# Patient Record
Sex: Female | Born: 1985 | Race: White | Hispanic: No | Smoking: Never smoker
Health system: Southern US, Community
[De-identification: ages and names within clinical notes are randomized; demographics above are authoritative.]

## PROBLEM LIST (undated history)

## (undated) DIAGNOSIS — Z8614 Personal history of Methicillin resistant Staphylococcus aureus infection: Secondary | ICD-10-CM

## (undated) DIAGNOSIS — F419 Anxiety disorder, unspecified: Secondary | ICD-10-CM

## (undated) DIAGNOSIS — F32A Depression, unspecified: Secondary | ICD-10-CM

## (undated) DIAGNOSIS — G4700 Insomnia, unspecified: Secondary | ICD-10-CM

## (undated) DIAGNOSIS — N926 Irregular menstruation, unspecified: Secondary | ICD-10-CM

## (undated) DIAGNOSIS — F319 Bipolar disorder, unspecified: Secondary | ICD-10-CM

## (undated) HISTORY — DX: Bipolar disorder, unspecified: F31.9

## (undated) HISTORY — PX: WISDOM TOOTH EXTRACTION: SHX21

## (undated) HISTORY — PX: ULNAR NERVE REPAIR: SHX2594

## (undated) HISTORY — DX: Anxiety disorder, unspecified: F41.9

## (undated) HISTORY — DX: Irregular menstruation, unspecified: N92.6

## (undated) HISTORY — DX: Depression, unspecified: F32.A

## (undated) HISTORY — DX: Insomnia, unspecified: G47.00

---
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
## 2018-09-30 ENCOUNTER — Emergency Department (HOSPITAL_COMMUNITY)
Admission: EM | Admit: 2018-09-30 | Discharge: 2018-09-30 | Disposition: A | Discharge status: Home / Self Care | Payer: BLUE CROSS/BLUE SHIELD | Attending: Emergency Medicine | Admitting: Emergency Medicine

## 2018-09-30 ENCOUNTER — Encounter (HOSPITAL_COMMUNITY): Payer: Self-pay

## 2018-09-30 ENCOUNTER — Other Ambulatory Visit: Payer: Self-pay

## 2018-09-30 DIAGNOSIS — L03011 Cellulitis of right finger: Secondary | ICD-10-CM

## 2018-09-30 MED ORDER — BACITRACIN ZINC 500 UNIT/GM EX OINT: TOPICAL_OINTMENT | Freq: Once | CUTANEOUS | Status: DC

## 2018-09-30 MED ORDER — LIDOCAINE HCL (PF) 1 % IJ SOLN
10.0000 mL | Freq: Once | INTRAMUSCULAR | Status: AC
  Administered 2018-09-30: 10 mL
  Filled 2018-09-30: qty 10

## 2018-09-30 MED ORDER — CEPHALEXIN 500 MG PO CAPS: 500.0000 mg | ORAL_CAPSULE | Freq: Four times a day (QID) | ORAL | 0 refills | Status: DC

## 2018-09-30 NOTE — ED Triage Notes (Signed)
Pt arrived with c/o nail being slammed between bed frame while working.

## 2018-09-30 NOTE — ED Provider Notes (Signed)
[facility] EMERGENCY DEPARTMENT Provider Note   CSN: [id number] Arrival date & time: 09/30/18  0105    History   Chief Complaint Chief Complaint  Patient presents with  . Nail Problem    HPI Allison Mccarty is a 33 y.o. female.     Allison Mccarty is a 33 y.o. female who is otherwise healthy, presents to the emergency department for evaluation of pain and swelling to the right middle finger.  She reports about a week ago while moving the skin was torn away from her nailbed in this area and then a few days later this area became swollen and painful, she reports last night she had some purulent drainage from it and the swelling seemed to go down some but it is still very painful to the touch.  No swelling or pain over the digit pad or swelling over the proximal finger.  She denies fevers or chills, nausea or vomiting.  Reports that the finger has occasionally been tingling but no numbness or weakness and she has no difficulty moving the finger.  She has not taken anything to treat her symptoms prior to arrival, no other aggravating or alleviating factors.  Tetanus updated 2 months ago.      History reviewed. No pertinent past medical history.  There are no active problems to display for this patient.   History reviewed. No pertinent surgical history.   OB History   No obstetric history on file.      Home Medications    Prior to Admission medications   Medication Sig Start Date End Date Taking? Authorizing Provider  cephALEXin (KEFLEX) 500 MG capsule Take 1 capsule (500 mg total) by mouth 4 (four) times daily. 09/30/18   Dartha Lodge, PA-C    Family History History reviewed. No pertinent family history.  Social History Social History   Tobacco Use  . Smoking status: Not on file  Substance Use Topics  . Alcohol use: Not on file  . Drug use: Not on file     Allergies   Patient has no allergy information on record.   Review of  Systems Review of Systems  Constitutional: Negative for chills and fever.  Gastrointestinal: Negative for nausea and vomiting.  Skin: Positive for color change and wound.  Neurological: Negative for weakness and numbness.     Physical Exam Updated Vital Signs BP 104/65 (BP Location: Left Arm)   Pulse 66   Temp 97.9 F (36.6 C) (Oral)   Resp 18   SpO2 99%   Physical Exam Vitals signs and nursing note reviewed.  Constitutional:      General: She is not in acute distress.    Appearance: She is well-developed. She is not diaphoretic.  HENT:     Head: Normocephalic and atraumatic.  Eyes:     General:        Right eye: No discharge.        Left eye: No discharge.  Pulmonary:     Effort: Pulmonary effort is normal. No respiratory distress.  Musculoskeletal:     Comments: Redness and swelling along of the right middle finger consistent with paronychia, no expressible drainage currently.  Digit pad is soft, no swelling or erythema of the proximal finger  Skin:    General: Skin is warm and dry.  Neurological:     Mental Status: She is alert.     Coordination: Coordination normal.  Psychiatric:        Mood and Affect: Mood  normal.        Behavior: Behavior normal.      ED Treatments / Results  Labs (all labs ordered are listed, but only abnormal results are displayed) Labs Reviewed - No data to display  EKG None  Radiology No results found.  Procedures .Marland KitchenIncision and Drainage Date/Time: 09/30/2018 2:02 AM Performed by: Dartha Lodge, PA-C Authorized by: Dartha Lodge, PA-C   Consent:    Consent obtained:  Verbal   Consent given by:  Patient   Risks discussed:  Bleeding, incomplete drainage, pain, infection and damage to other organs Location:    Type:  Abscess (Paronychia)   Location:  Upper extremity   Upper extremity location:  Finger   Finger location:  R long finger Pre-procedure details:    Skin preparation:  Antiseptic wash Anesthesia (see MAR for  exact dosages):    Anesthesia method:  Nerve block   Block location:  Digital block   Block needle gauge:  25 G   Block anesthetic:  Lidocaine 1% w/o epi   Block injection procedure:  Anatomic landmarks identified, introduced needle and incremental injection   Block outcome:  Anesthesia achieved Procedure details:    Incision types:  Single straight   Incision depth:  Dermal   Scalpel blade:  11   Wound management:  Probed and deloculated and irrigated with saline   Drainage:  Purulent and bloody   Drainage amount:  Moderate   Wound treatment:  Wound left open   Packing materials:  None Post-procedure details:    Patient tolerance of procedure:  Tolerated well, no immediate complications   (including critical care time)  Medications Ordered in ED Medications  bacitracin ointment (has no administration in time range)  lidocaine (PF) (XYLOCAINE) 1 % injection 10 mL (10 mLs Infiltration Given by Other 09/30/18 0137)     Initial Impression / Assessment and Plan / ED Course  I have reviewed the triage vital signs and the nursing notes.  Pertinent labs & imaging results that were available during my care of the patient were reviewed by me and considered in my medical decision making (see chart for details).  Patient presents with paronychia of the right middle finger.  The finger was anesthetized with digital block and paronychia was drained with small amount of purulent drainage and bloody drainage.  Patient tolerated procedure well with no complications.  Dressing applied.  Will treat patient with Keflex for surrounding cellulitis.  Discharged home in good condition.  Discussed signs of infection that should warrant sooner return.  Patient expresses understanding and agreement with plan.  Final Clinical Impressions(s) / ED Diagnoses   Final diagnoses:  Paronychia of finger of right hand    ED Discharge Orders         Ordered    cephALEXin (KEFLEX) 500 MG capsule  4 times daily      09/30/18 0201           Dartha Lodge, PA-C 09/30/18 0230    Melene Plan, DO 09/30/18 [phone]

## 2018-09-30 NOTE — ED Notes (Signed)
Patient verbalizes understanding of discharge instructions. Opportunity for questioning and answers were provided. Armband removed by staff, pt discharged from ED.  

## 2018-09-30 NOTE — Discharge Instructions (Signed)
Take entire course of antibiotics as directed.  Soak finger in clean warm water at least twice daily and change dressing twice daily.  You can apply antibiotic ointment under your dressing.  Monitor area for signs of worsening infection such as redness, swelling, increasing pain or drainage.  If these occur return for reevaluation otherwise this should heal and improve on its own.

## 2019-08-01 ENCOUNTER — Encounter (HOSPITAL_COMMUNITY): Payer: Self-pay | Admitting: Emergency Medicine

## 2019-08-01 ENCOUNTER — Ambulatory Visit (INDEPENDENT_AMBULATORY_CARE_PROVIDER_SITE_OTHER): Payer: BLUE CROSS/BLUE SHIELD

## 2019-08-01 ENCOUNTER — Ambulatory Visit (HOSPITAL_COMMUNITY)
Admission: EM | Admit: 2019-08-01 | Discharge: 2019-08-01 | Disposition: A | Discharge status: Home / Self Care | Payer: BLUE CROSS/BLUE SHIELD | Attending: Family Medicine | Admitting: Family Medicine

## 2019-08-01 ENCOUNTER — Other Ambulatory Visit: Payer: Self-pay

## 2019-08-01 DIAGNOSIS — M25522 Pain in left elbow: Secondary | ICD-10-CM

## 2019-08-01 MED ORDER — IBUPROFEN 600 MG PO TABS: 600.0000 mg | ORAL_TABLET | Freq: Three times a day (TID) | ORAL | 0 refills | Status: DC | PRN

## 2019-08-01 NOTE — Discharge Instructions (Signed)
Please try ice  Please try the ibuprofen as needed  Please follow up if your symptoms fail to improve.

## 2019-08-01 NOTE — ED Provider Notes (Signed)
[facility]    CSN: [id number] Arrival date & time: 08/01/19  1452      History   Chief Complaint Chief Complaint  Patient presents with  . Fall  . Arm Pain    HPI Allison Mccarty is a 34 y.o. female.   She is presenting with a fall that occurred last night.  She fell onto her outstretched hands.  Now she is having pain over the elbow itself.  Has some pain with complete extension.  No significant bruising.  Has some pain with pronation and supination of the left hand.  No history of similar symptoms.  Pain is moderate to severe.  Is intermittent in nature.  HPI  History reviewed. No pertinent past medical history.  There are no problems to display for this patient.   History reviewed. No pertinent surgical history.  OB History   No obstetric history on file.      Home Medications    Prior to Admission medications   Medication Sig Start Date End Date Taking? Authorizing Provider  traZODone (DESYREL) 50 MG tablet Take by mouth. 07/17/18  Yes [provider]  cephALEXin (KEFLEX) 500 MG capsule Take 1 capsule (500 mg total) by mouth 4 (four) times daily. 09/30/18   Dartha Lodge, PA-C  ibuprofen (ADVIL) 600 MG tablet Take 1 tablet (600 mg total) by mouth every 8 (eight) hours as needed. 08/01/19   Myra Rude, MD    Family History Family History  Problem Relation Age of Onset  . Cancer Mother     Social History Social History   Tobacco Use  . Smoking status: Never Smoker  Substance Use Topics  . Alcohol use: Yes  . Drug use: Not Currently     Allergies   Patient has no known allergies.   Review of Systems Review of Systems  Constitutional: Negative for fever.  Musculoskeletal: Negative for gait problem.  Neurological: Negative for weakness.     Physical Exam Triage Vital Signs ED Triage Vitals [08/01/19 1518]  Enc Vitals Group     BP 114/75     Pulse Rate 89     Resp 18     Temp 98.2 F (36.8 C)     Temp src    SpO2 97 %     Weight      Height      Head Circumference      Peak Flow      Pain Score 7     Pain Loc      Pain Edu?      Excl. in GC?    No data found.  Updated Vital Signs BP 114/75   Pulse 89   Temp 98.2 F (36.8 C)   Resp 18   LMP 08/01/2019 (Exact Date)   SpO2 97%   Visual Acuity Right Eye Distance:   Left Eye Distance:   Bilateral Distance:    Right Eye Near:   Left Eye Near:    Bilateral Near:     Physical Exam Gen: NAD, alert, cooperative with exam, well-appearing ENT: normal lips, normal nasal mucosa,  Eye: normal EOM, normal conjunctiva and lids CV:  no edema, +2 pedal pulses   Resp: no accessory muscle use, non-labored,  Skin: no rashes, no areas of induration  Neuro: normal tone, normal sensation to touch Psych:  normal insight, alert and oriented MSK:  Left arm: Normal shoulder range of motion. Normal internal and external rotation. Normal strength resistance. Left elbow with  almost near extension. Normal strength resistance with supination and pronation. Normal wrist range of motion. Normal grip strength. Neurovascularly intact  UC Treatments / Results  Labs (all labs ordered are listed, but only abnormal results are displayed) Labs Reviewed - No data to display  EKG   Radiology DG Elbow Complete Left  Result Date: 08/01/2019 CLINICAL DATA:  Pain EXAM: LEFT ELBOW - COMPLETE 3+ VIEW COMPARISON:  None. FINDINGS: There is no evidence of fracture, dislocation, or joint effusion. There is no evidence of arthropathy or other focal bone abnormality. Soft tissues are unremarkable. IMPRESSION: Negative. Electronically Signed   By: Constance Holster M.D.   On: 08/01/2019 16:15    Procedures Procedures (including critical care time)  Medications Ordered in UC Medications - No data to display  Initial Impression / Assessment and Plan / UC Course  I have reviewed the triage vital signs and the nursing notes.  Pertinent labs & imaging results  that were available during my care of the patient were reviewed by me and considered in my medical decision making (see chart for details).     [name] is a 34 yo F that is presenting with left elbow pain after a fall. Imaging didn't suggest a fracture. Provided ibuprofen. Counseled on home exercise program and supportive care. Given indications to follow up and return.   Final Clinical Impressions(s) / UC Diagnoses   Final diagnoses:  Left elbow pain     Discharge Instructions     Please try ice  Please try the ibuprofen as needed  Please follow up if your symptoms fail to improve.     ED Prescriptions    Medication Sig Dispense Auth. Provider   ibuprofen (ADVIL) 600 MG tablet Take 1 tablet (600 mg total) by mouth every 8 (eight) hours as needed. 30 tablet Rosemarie Ax, MD     PDMP not reviewed this encounter.   Rosemarie Ax, MD 08/01/19 [phone]

## 2019-08-01 NOTE — ED Triage Notes (Signed)
Pt states she was standing on a step stool and she fell backwards and had her arms outstretched behind her, landed mostly on her L arm. C/o L shoulder pain, L elbow pain.

## 2020-02-20 LAB — HM PAP SMEAR: HM Pap smear: NEGATIVE

## 2020-02-20 LAB — RESULTS CONSOLE HPV: CHL HPV: NEGATIVE

## 2021-05-31 LAB — CBC AND DIFFERENTIAL
HCT: 41 (ref 36–46)
Hemoglobin: 13.4 (ref 12.0–16.0)
Platelets: 244 10*3/uL (ref 150–400)
WBC: 9

## 2021-05-31 LAB — BASIC METABOLIC PANEL
BUN: 8 (ref 4–21)
CO2: 24 — AB (ref 13–22)
Chloride: 104 (ref 99–108)
Creatinine: 0.8 (ref 0.5–1.1)
Glucose: 93
Potassium: 4.5 mEq/L (ref 3.5–5.1)
Sodium: 139 (ref 137–147)

## 2021-05-31 LAB — HEPATIC FUNCTION PANEL
ALT: 29 U/L (ref 7–35)
AST: 23 (ref 13–35)
Alkaline Phosphatase: 84 (ref 25–125)
Bilirubin, Total: 0.2

## 2021-05-31 LAB — LIPID PANEL
Cholesterol: 189 (ref 0–200)
HDL: 62 (ref 35–70)
LDL Cholesterol: 111
Triglycerides: 82 (ref 40–160)

## 2021-05-31 LAB — HM HEPATITIS C SCREENING LAB: HM Hepatitis Screen: NEGATIVE

## 2021-05-31 LAB — TSH: TSH: 1.61 (ref 0.41–5.90)

## 2021-11-08 ENCOUNTER — Telehealth: Payer: BC Managed Care – PPO | Admitting: Emergency Medicine

## 2021-11-08 ENCOUNTER — Ambulatory Visit: Payer: Self-pay

## 2021-11-08 DIAGNOSIS — K6289 Other specified diseases of anus and rectum: Secondary | ICD-10-CM

## 2021-11-08 NOTE — Progress Notes (Signed)
?Virtual Visit Consent  ? ?Allison Mccarty, you are scheduled for a virtual visit with a [facility] provider today.   ?  ?Just as with appointments in the office, your consent must be obtained to participate.  Your consent will be active for this visit and any virtual visit you may have with one of our providers in the next 365 days.   ?  ?If you have a MyChart account, a copy of this consent can be sent to you electronically.  All virtual visits are billed to your insurance company just like a traditional visit in the office.   ? ?As this is a virtual visit, video technology does not allow for your provider to perform a traditional examination.  This may limit your provider's ability to fully assess your condition.  If your provider identifies any concerns that need to be evaluated in person or the need to arrange testing (such as labs, EKG, etc.), we will make arrangements to do so.   ?  ?Although advances in technology are sophisticated, we cannot ensure that it will always work on either your end or our end.  If the connection with a video visit is poor, the visit may have to be switched to a telephone visit.  With either a video or telephone visit, we are not always able to ensure that we have a secure connection.    ? ?I need to obtain your verbal consent now.   Are you willing to proceed with your visit today?  ?  ?[name] has provided verbal consent on 11/08/2021 for a virtual visit (video or telephone). ?  ?Roxy Horseman, PA-C  ? ?Date: 11/08/2021 12:01 PM ? ? ?Virtual Visit via Video Note  ? ?Allison Mccarty, connected with  Allison Mccarty  ([id number], April 10, 1986) on 11/08/21 at 12:00 PM EDT by a video-enabled telemedicine application and verified that I am speaking with the correct person using two identifiers. ? ?Location: ?Patient: Virtual Visit Location Patient: Mobile ?Provider: Virtual Visit Location Provider: Home Office ?  ?I discussed the limitations of evaluation and  management by telemedicine and the availability of in person appointments. The patient expressed understanding and agreed to proceed.   ? ?History of Present Illness: ?Allison Mccarty is a 36 y.o. who identifies as a female who was assigned female at birth, and is being seen today for severe rectal pain that started last night.  She reports having some blood in her stool that she doesn't associate with her menstrual cycle.  She states that the pain is unlike that of hemorrhoids and feels like something is "sucking her insides out."  She rates the pain as a 10/10 in severity.  She states that she almost went to the ER last night when it started. ? ?HPI: HPI  ?Problems: There are no problems to display for this patient. ?  ?Allergies: No Known Allergies ?Medications:  ?Current Outpatient Medications:  ?  cephALEXin (KEFLEX) 500 MG capsule, Take 1 capsule (500 mg total) by mouth 4 (four) times daily., Disp: 28 capsule, Rfl: 0 ?  ibuprofen (ADVIL) 600 MG tablet, Take 1 tablet (600 mg total) by mouth every 8 (eight) hours as needed., Disp: 30 tablet, Rfl: 0 ?  traZODone (DESYREL) 50 MG tablet, Take by mouth., Disp: , Rfl:  ? ?Observations/Objective: ?Patient is well-developed, well-nourished in no acute distress.  ?Head is normocephalic, atraumatic.  ?No labored breathing.  ?Speech is clear and coherent with logical content.  ?Patient is alert and oriented at baseline.  ? ? ?  Assessment and Plan: ?1. Rectal pain ? ?Redirect to ED for in person exam.  Discussed with patient that due to the amount of pain she is having with blood in the stool that she doesn't associate with her menstrual cycle, she may need advanced imaging. ? ?Follow Up Instructions: ?I discussed the assessment and treatment plan with the patient. The patient was provided an opportunity to ask questions and all were answered. The patient agreed with the plan and demonstrated an understanding of the instructions.  A copy of instructions were sent to the  patient via MyChart unless otherwise noted below.  ? ? ? ?The patient was advised to call back or seek an in-person evaluation if the symptoms worsen or if the condition fails to improve as anticipated. ? ?Time:  ?I spent 12 minutes with the patient via telehealth technology discussing the above problems/concerns.   ? ?Roxy Horseman, PA-C ? ?

## 2021-11-08 NOTE — Patient Instructions (Addendum)
Based on what you shared with me, I feel your condition warrants further evaluation as soon as possible at an Emergency department. I'm concerned that you may need advanced imaging based on the description of your pain.  This might include ultrasound or CT scan.   ?  ?NOTE: There will be NO CHARGE for this Visit ?  ?If you are having a true medical emergency please call 911.   ?  ? ?Emergency Department-[facility]  ?Get Driving Directions  ?[phone]  ?[address]  ?[place], [place] [address]  ?Open 24/7/365  ?  ?  ?[facility] Emergency Department at [facility]  ?Get Driving Directions  ?[address]  ?[place], [place] [address]  ?Open 24/7/365  ?  ?Emergency Department- [facility]  ?Get Driving Directions  ?[phone]  ?[address]  ?[place], [place] [address]  ?Open 24/7/365  ?  ?  ?[facility] Emergency Department at [facility]  ?Get Driving Directions  ?[phone]  ?[address]  ?[place], [place] [address]  ?Open 24/7/365  ?  ?[place]  ?Emergency Department- [facility]  ?Get Driving Directions  ?[phone]  ?[address]  ?[place], [place] [address]  ?Open 24/7/365  ?  ?[place]  ?Emergency Department- [facility] [place]  ?Get Driving Directions  ?[address]  ?[place], [place] [address]  ?Open 24/7/365  ?  ?[place]  ?Emergency Department- [facility]  ?Get Driving Directions  ?[phone]  ?[address]  ?[place], [place] [address]  ?Open 24/7/365  ?  ?

## 2021-11-08 NOTE — Telephone Encounter (Signed)
?  Chief Complaint: abdominal pain ?Symptoms: abdominal pain that radiates into back, comes and goes, pain with urination and urgency ?Frequency: yesterday around 4:00pm ?Pertinent Negatives: Patient denies burning with urination ?Disposition: [] ED /[] Urgent Care (no appt availability in office) / [] Appointment(In office/virtual)/ [x]  [facility]/ [] Home Care/ [] Refused Recommended Disposition /[] [facility]/ []  Follow-up with PCP ?Additional Notes: pt scheduled new pt appt with [facility] but they dont offer acute visits prior to NPA. I advised pt she could go to UC to be seen or if set up Mychart we could schedule for VUC visit and they could let her know if she needed to be seen in person or prescribe something during visit. Pt states she will set up Mychart and schedule appt. Confirmed pt scheduled VUC visit today at 1200.  ? ?Reason for Disposition ? [1] MODERATE pain (e.g., interferes with normal activities) AND [2] pain comes and goes (cramps) AND [3] present > 24 hours  (Exception: pain with Vomiting or Diarrhea - see that Guideline) ? ?Answer Assessment - Initial Assessment Questions ?1. LOCATION: "Where does it hurt?"  ?    Lower back and lower abdomen  ?2. RADIATION: "Does the pain shoot anywhere else?" (e.g., chest, back) ?    Back  ?3. ONSET: "When did the pain begin?" (e.g., minutes, hours or days ago)  ?    yesterday ?5. PATTERN "Does the pain come and go, or is it constant?" ?   - If constant: "Is it getting better, staying the same, or worsening?"  ?    (Note: Constant means the pain never goes away completely; most serious pain is constant and it progresses)  ?   - If intermittent: "How long does it last?" "Do you have pain now?" ?    (Note: Intermittent means the pain goes away completely between bouts) ?    Comes and goes ?6. SEVERITY: "How bad is the pain?"  (e.g., Scale 1-10; mild, moderate, or severe) ?  - MILD (1-3): doesn't interfere with normal activities, abdomen soft and  not tender to touch  ?  - MODERATE (4-7): interferes with normal activities or awakens from sleep, abdomen tender to touch  ?  - SEVERE (8-10): excruciating pain, doubled over, unable to do any normal activities  ?    10 when present, 30 mins after 5 ?9. RELIEVING/AGGRAVATING FACTORS: "What makes it better or worse?" (e.g., movement, antacids, bowel movement) ?    Standing up worse, laying down better ?10. OTHER SYMPTOMS: "Do you have any other symptoms?" (e.g., back pain, diarrhea, fever, urination pain, vomiting) ?      Urination pain, urgency ?11. PREGNANCY: "Is there any chance you are pregnant?" "When was your last menstrual period?" ?      On period ? ?Protocols used: Abdominal Pain - Female-A-AH ? ?

## 2021-11-16 ENCOUNTER — Other Ambulatory Visit: Payer: Self-pay | Admitting: Internal Medicine

## 2021-11-16 DIAGNOSIS — N939 Abnormal uterine and vaginal bleeding, unspecified: Secondary | ICD-10-CM

## 2021-11-17 ENCOUNTER — Ambulatory Visit
Admission: RE | Admit: 2021-11-17 | Discharge: 2021-11-17 | Disposition: A | Discharge status: Home / Self Care | Payer: BC Managed Care – PPO | Source: Ambulatory Visit | Attending: Internal Medicine | Admitting: Internal Medicine

## 2021-11-17 DIAGNOSIS — N939 Abnormal uterine and vaginal bleeding, unspecified: Secondary | ICD-10-CM | POA: Diagnosis present

## 2021-12-20 ENCOUNTER — Encounter: Payer: Self-pay | Admitting: Internal Medicine

## 2021-12-20 ENCOUNTER — Ambulatory Visit: Payer: BC Managed Care – PPO | Admitting: Internal Medicine

## 2021-12-20 ENCOUNTER — Other Ambulatory Visit: Payer: Self-pay | Admitting: Internal Medicine

## 2021-12-20 VITALS — BP 122/84 | HR 86 | Ht 65.0 in | Wt 242.0 lb

## 2021-12-20 DIAGNOSIS — K594 Anal spasm: Secondary | ICD-10-CM | POA: Diagnosis not present

## 2021-12-20 DIAGNOSIS — R7303 Prediabetes: Secondary | ICD-10-CM

## 2021-12-20 DIAGNOSIS — K625 Hemorrhage of anus and rectum: Secondary | ICD-10-CM | POA: Diagnosis not present

## 2021-12-20 DIAGNOSIS — F32A Depression, unspecified: Secondary | ICD-10-CM | POA: Insufficient documentation

## 2021-12-20 DIAGNOSIS — F331 Major depressive disorder, recurrent, moderate: Secondary | ICD-10-CM

## 2021-12-20 DIAGNOSIS — N926 Irregular menstruation, unspecified: Secondary | ICD-10-CM | POA: Diagnosis not present

## 2021-12-20 DIAGNOSIS — F419 Anxiety disorder, unspecified: Secondary | ICD-10-CM | POA: Insufficient documentation

## 2021-12-20 LAB — POCT GLYCOSYLATED HEMOGLOBIN (HGB A1C): Hemoglobin A1C: 5.3 % (ref 4.0–5.6)

## 2021-12-20 MED ORDER — HYDROCORTISONE (PERIANAL) 2.5 % EX CREA: 1.0000 "application " | TOPICAL_CREAM | Freq: Two times a day (BID) | CUTANEOUS | 2 refills | Status: DC
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
## 2021-12-25 IMAGING — DX DG ELBOW COMPLETE 3+V*L*
4 series · 4 of 4 positions shown · non-contrast
Comparison: None.

CLINICAL DATA: Pain

EXAM:
LEFT ELBOW - COMPLETE 3+ VIEW

[elbow ap]
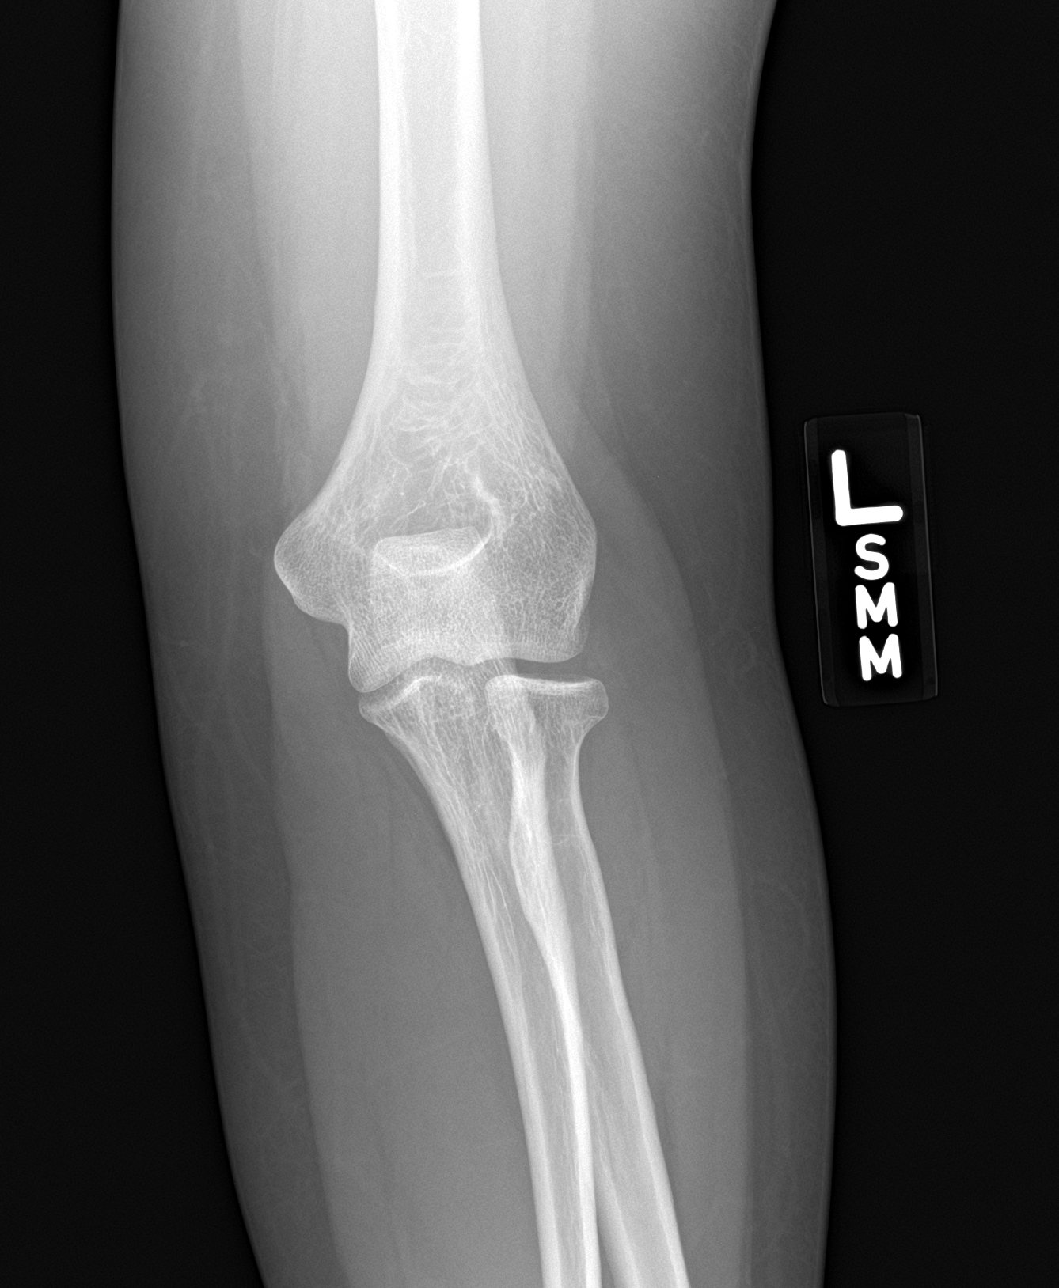

[elbow obl (1 of 2)]
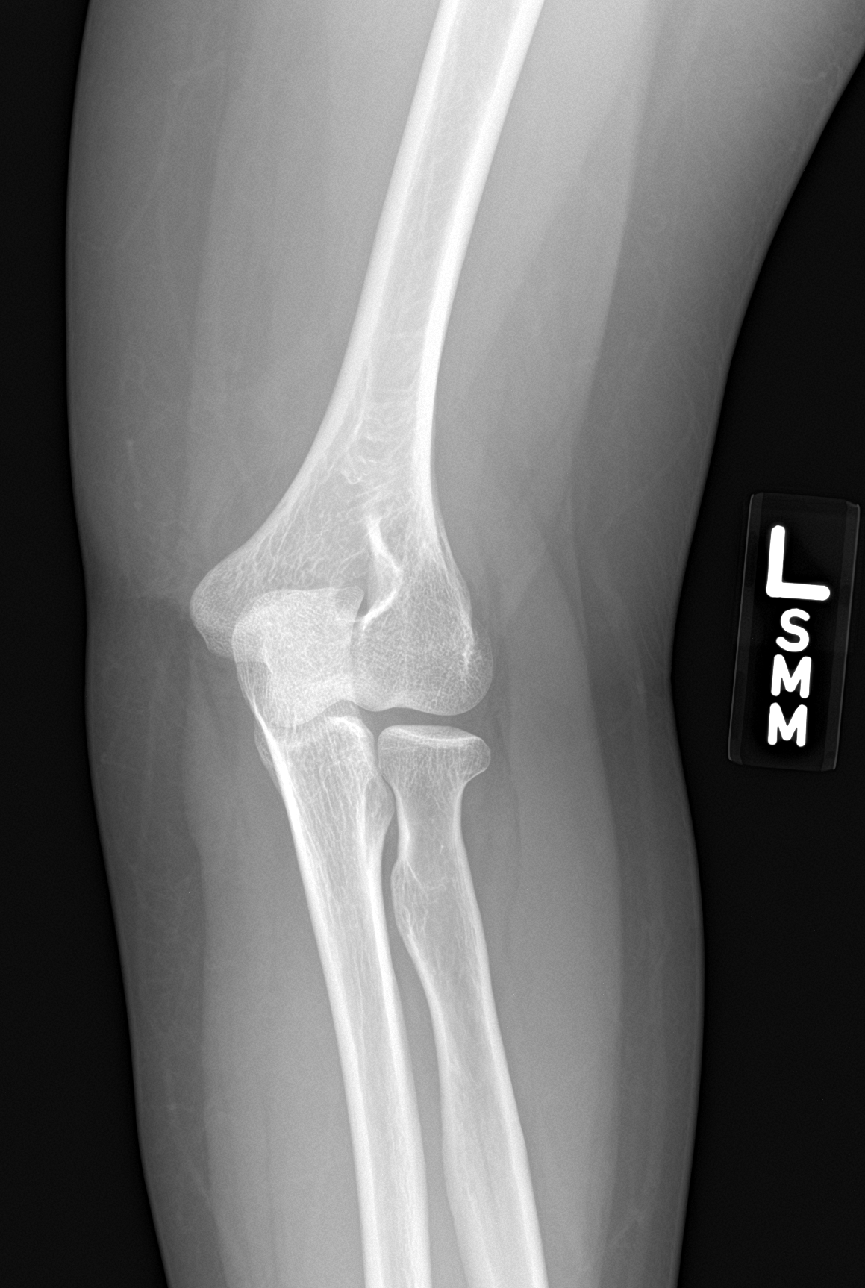

[elbow obl (2 of 2)]
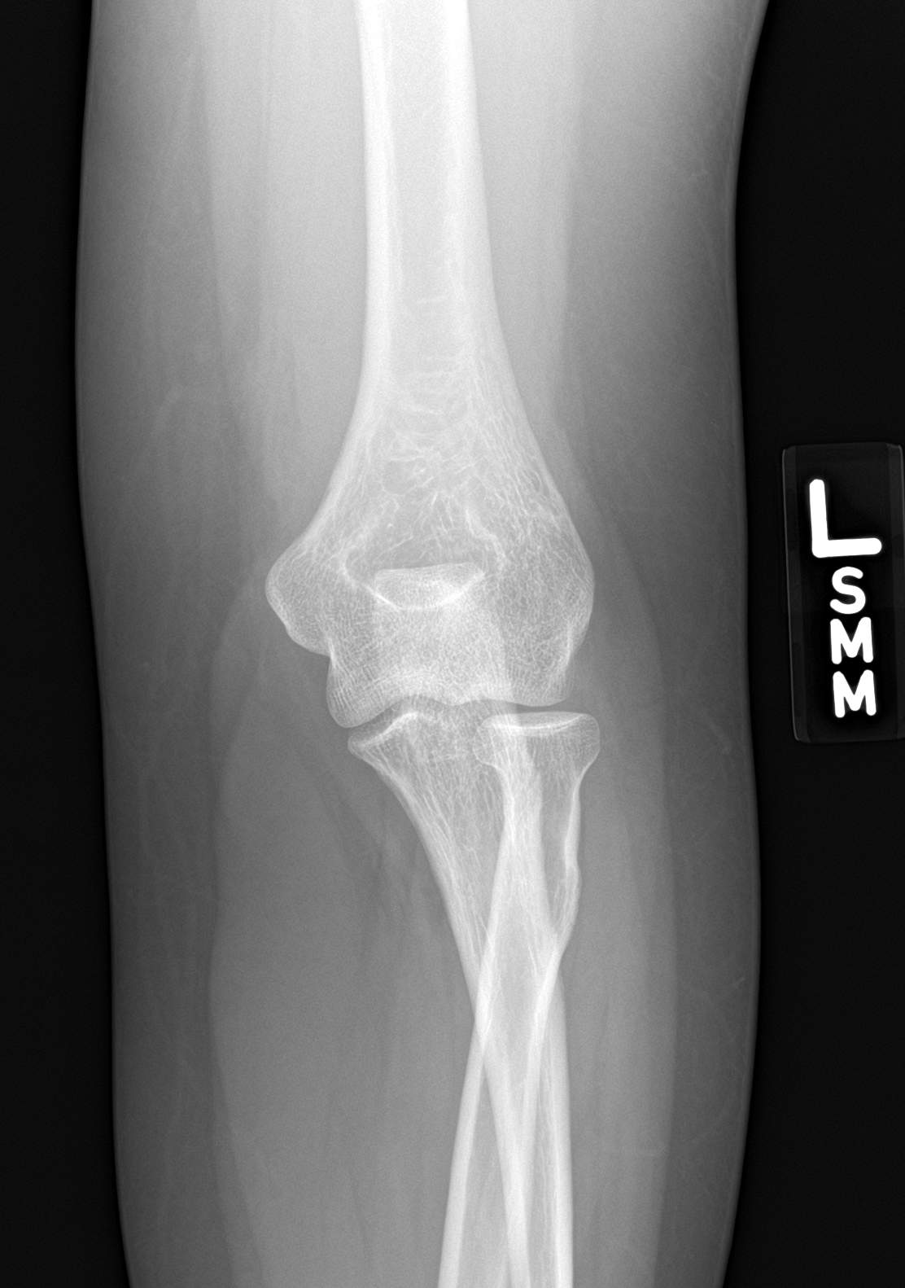

[elbow lat]
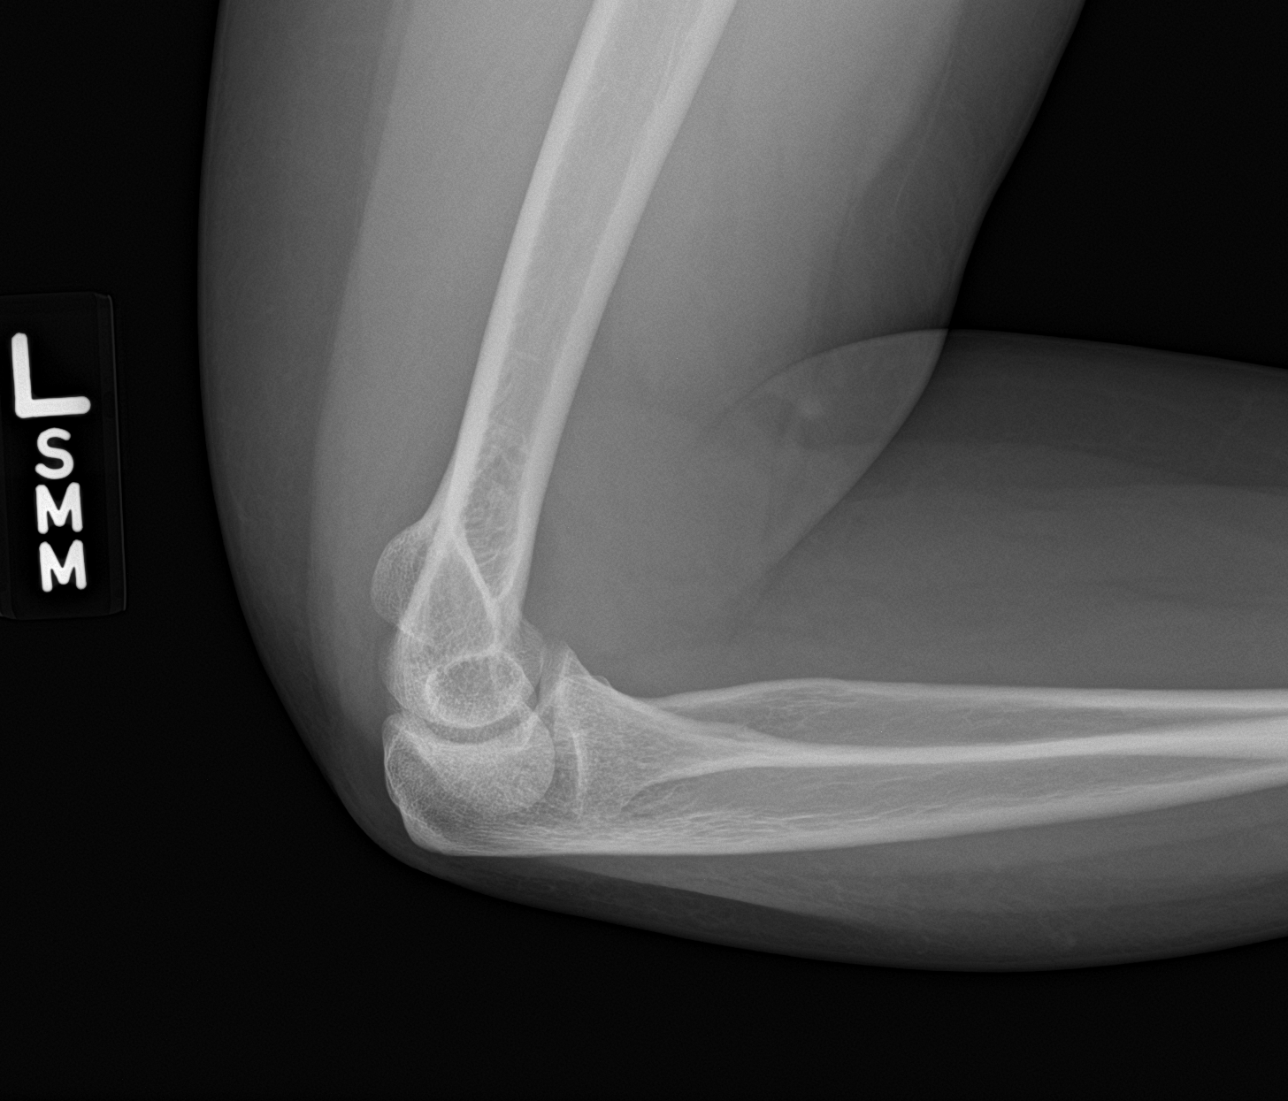

[4 of 4 positions shown; findings below may reference images not displayed]

FINDINGS: There is no evidence of fracture, dislocation, or joint effusion.
There is no evidence of arthropathy or other focal bone abnormality.
Soft tissues are unremarkable.
IMPRESSION: Negative.

## 2021-12-26 ENCOUNTER — Encounter: Payer: Self-pay | Admitting: Internal Medicine

## 2021-12-26 ENCOUNTER — Other Ambulatory Visit: Payer: Self-pay | Admitting: Internal Medicine

## 2021-12-26 DIAGNOSIS — K625 Hemorrhage of anus and rectum: Secondary | ICD-10-CM

## 2021-12-26 MED ORDER — HYDROCORTISONE ACETATE 25 MG RE SUPP: 25.0000 mg | Freq: Two times a day (BID) | RECTAL | 0 refills | Status: DC

## 2022-01-02 ENCOUNTER — Encounter: Payer: Self-pay | Admitting: Internal Medicine

## 2022-01-24 ENCOUNTER — Encounter: Payer: Self-pay | Admitting: Internal Medicine

## 2022-01-26 NOTE — Telephone Encounter (Signed)
I attempt twice trying to reach patient via phone. 01/25/22 and 01/26/22. Leaving voicemail's for patient to call back to be scheduled.

## 2022-03-21 ENCOUNTER — Encounter: Payer: Self-pay | Admitting: Advanced Practice Midwife

## 2022-03-21 ENCOUNTER — Ambulatory Visit: Payer: BLUE CROSS/BLUE SHIELD | Admitting: Internal Medicine

## 2022-04-10 ENCOUNTER — Ambulatory Visit: Payer: BC Managed Care – PPO | Admitting: Gastroenterology

## 2022-06-30 ENCOUNTER — Encounter: Payer: BC Managed Care – PPO | Admitting: Internal Medicine

## 2022-07-06 ENCOUNTER — Ambulatory Visit (INDEPENDENT_AMBULATORY_CARE_PROVIDER_SITE_OTHER): Payer: Self-pay | Admitting: Gastroenterology

## 2022-07-06 ENCOUNTER — Encounter: Payer: Self-pay | Admitting: Gastroenterology

## 2022-07-06 VITALS — BP 109/67 | HR 74 | Temp 98.7°F | Ht 65.0 in | Wt 220.0 lb

## 2022-07-06 DIAGNOSIS — K602 Anal fissure, unspecified: Secondary | ICD-10-CM

## 2022-07-06 NOTE — Progress Notes (Signed)
Gastroenterology Consultation  Referring Provider:     Glean Hess, MD Primary Care Physician:  Glean Hess, MD Primary Gastroenterologist:  Dr. Allen Norris     Reason for Consultation:     Rectal pain and rectal bleeding        HPI:   Allison Mccarty is a 37 y.o. y/o female referred for consultation & management of rectal pain and rectal bleeding by Dr. Army Melia, Jesse Sans, MD. this patient comes in today after being evaluated by her primary care provider for a history of rectal pain with rectal bleeding.  The patient had reported to her primary care provider that the rectal pain was only during her menstrual cycle.  The patient has had intermittent rectal bleeding.  It was reported that the rectal bleeding was painless.  Past Medical History:  Diagnosis Date   Anxiety 2015   Bipolar 1 disorder ([place])    Depression 2009   Insomnia    Irregular menstrual bleeding     No past surgical history on file.  Prior to Admission medications   Medication Sig Start Date End Date Taking? Authorizing Provider  cloNIDine (CATAPRES) 0.1 MG tablet Take 0.1-0.2 mg by mouth at bedtime as needed. 09/01/21   [provider]  hydrocortisone (ANUSOL-HC) 2.5 % rectal cream Place 1 application. rectally 2 (two) times daily. 12/20/21   Glean Hess, MD  hydrocortisone (ANUSOL-HC) 25 MG suppository Place 1 suppository (25 mg total) rectally 2 (two) times daily. 12/26/21   Glean Hess, MD  lamoTRIgine (LAMICTAL) 100 MG tablet Take 100 mg by mouth daily. Bipolar 11/23/21   [provider]  NIKKI 3-0.02 MG tablet Take 1 tablet by mouth daily. 11/30/21   [provider]    Family History  Problem Relation Age of Onset   Breast cancer Mother    Uterine cancer Mother    Heart disease Father    Parkinsonism Father    Ovarian cancer Maternal Aunt    Breast cancer Maternal Aunt    Stroke Maternal Grandmother    Breast cancer Maternal Grandmother      Social History    Tobacco Use   Smoking status: Never  Vaping Use   Vaping Use: Never used  Substance Use Topics   Alcohol use: Not Currently   Drug use: Not Currently    Allergies as of 07/06/2022   (No Known Allergies)    Review of Systems:    All systems reviewed and negative except where noted in HPI.   Physical Exam:  BP 109/67 (BP Location: Right Arm, Patient Position: Sitting, Cuff Size: Large)   Pulse 74   Temp 98.7 F (37.1 C) (Oral)   Ht 5\' 5"  (1.651 m)   Wt 220 lb (99.8 kg)   BMI 36.61 kg/m  No LMP recorded. General:   Alert,  Well-developed, well-nourished, pleasant and cooperative in NAD Head:  Normocephalic and atraumatic. Eyes:  Sclera clear, no icterus.   Conjunctiva pink. Ears:  Normal auditory acuity. Neck:  Supple; no masses or thyromegaly. Rectal: Digital rectal exam was painful at the 12 o'clock position with a palpable defect in the mucosa consistent with a fissure.  Skin:  Intact without significant lesions or rashes.  No jaundice. Psych:  Alert and cooperative. Normal mood and affect.  Imaging Studies: No results found.  Assessment and Plan:   Allison Mccarty is a 36 y.o. y/o female who comes in today with rectal pain and rectal bleeding.  The patient has  a fissure on rectal exam at the 12 o'clock position.  The pain was reproducible on the rectal exam.  The patient has been offered medical therapy with continued softening of her stools and avoid hard stools and dehydration.  The patient has also been set up for an appoint with Dr. Everlene Farrier for possible surgical intervention for the anal fissure with anal myotomy.  The patient has been explained the plan and agrees with it.    Midge Minium, MD. Clementeen Graham    Note: This dictation was prepared with Dragon dictation along with smaller phrase technology. Any transcriptional errors that result from this process are unintentional.

## 2022-07-10 ENCOUNTER — Ambulatory Visit (INDEPENDENT_AMBULATORY_CARE_PROVIDER_SITE_OTHER): Payer: Self-pay | Admitting: Surgery

## 2022-07-10 ENCOUNTER — Other Ambulatory Visit: Payer: Self-pay

## 2022-07-10 ENCOUNTER — Encounter: Payer: Self-pay | Admitting: Surgery

## 2022-07-10 ENCOUNTER — Telehealth: Payer: Self-pay

## 2022-07-10 ENCOUNTER — Encounter: Payer: Self-pay | Admitting: Internal Medicine

## 2022-07-10 VITALS — BP 136/85 | HR 75 | Temp 98.3°F | Ht 65.0 in | Wt 221.0 lb

## 2022-07-10 DIAGNOSIS — K602 Anal fissure, unspecified: Secondary | ICD-10-CM

## 2022-07-10 NOTE — Telephone Encounter (Signed)
Nifedipine ointment called to [facility].

## 2022-07-10 NOTE — Patient Instructions (Addendum)
Pick your medication up at the pharmacy below. Please call to see if its ready prior to picking it up.  [facility]  [address] [place] [address] [phone]    How to Take a Sitz Bath A sitz bath is a warm water bath that may be used to care for your rectum, genital area, or the area between your rectum and genitals (perineum). In a sitz bath, the water only comes up to your hips and covers your buttocks. A sitz bath may be done in a bathtub or with a portable sitz bath that fits over the toilet. Your health care provider may recommend a sitz bath to help: Relieve pain and discomfort after delivering a baby. Relieve pain and itching from hemorrhoids or anal fissures. Relieve pain after certain surgeries. Relax muscles that are sore or tight. How to take a sitz bath Take 2-4 sitz baths a day, or as many as told by your health care provider. Bathtub sitz bath To take a sitz bath in a bathtub: Partially fill a bathtub with warm water. The water should be deep enough to cover your hips and buttocks when you are sitting in the bathtub. Follow your health care provider's instructions if you are told to put medicine in the water. Sit in the water. Open the bathtub drain a little, and leave it open during your bath. Turn on the warm water again, enough to replace the water that is draining out. Keep the water running throughout your bath. This helps keep the water at the right level and temperature. Soak in the water for 15-20 minutes, or as long as told by your health care provider. When you are done, be careful when you stand up. You may feel dizzy. After the sitz bath, pat yourself dry. Do not rub your skin to dry it.  Over-the-toilet sitz bath To take a sitz bath with an over-the-toilet basin: Follow the manufacturer's instructions. Fill the basin with warm water. Follow your health care provider's instructions if you were told to put medicine in the water. Sit on the seat.  Make sure the water covers your buttocks and perineum. Soak in the water for 15-20 minutes, or as long as told by your health care provider. After the sitz bath, pat yourself dry. Do not rub your skin to dry it. Clean and dry the basin between uses. Discard the basin if it cracks, or according to the manufacturer's instructions.  Contact a health care provider if: Your pain or itching gets worse. Stop doing sitz baths if your symptoms get worse. You have new symptoms. Stop doing sitz baths until you talk with your health care provider. Summary A sitz bath is a warm water bath in which the water only comes up to your hips and covers your buttocks. Your health care provider may recommend a sitz bath to help relieve pain and discomfort after delivering a baby, relieve pain and itching from hemorrhoids or anal fissures, relieve pain after certain surgeries, or help to relax muscles that are sore or tight. Take 2-4 sitz baths a day, or as many as told by your health care provider. Soak in the water for 15-20 minutes. Stop doing sitz baths if your symptoms get worse. This information is not intended to replace advice given to you by your health care provider. Make sure you discuss any questions you have with your health care provider. Document Revised: 10/18/2021 Document Reviewed: 10/18/2021 Elsevier Patient Education  2023 [employer]. Anal Fissure,  Adult  An anal fissure is a small tear or crack in the tissue of the anus. Bleeding from a fissure usually stops on its own within a few minutes. However, bleeding will often occur again with each bowel movement until the fissure heals. What are the causes? This condition is usually caused by passing a large or hard stool (feces). Other causes include: Constipation. Frequent diarrhea. Inflammatory bowel disease (Crohn's disease or ulcerative colitis). Childbirth. Infections. Anal sex. What are the signs or symptoms? Symptoms of this condition  include: Bleeding from the rectum. Small amounts of blood seen on your stool, on the toilet paper, or in the toilet after a bowel movement. The blood coats the outside of the stool and is not mixed with the stool. Painful bowel movements. Itching or irritation around the anus. How is this diagnosed? A health care provider may diagnose this condition by closely examining the anal area. An anal fissure can usually be seen with careful inspection. In some cases, a rectal exam may be performed, or a short tube (anoscope) may be used to examine the anal canal. How is this treated? Initial treatment for this condition may include: Taking steps to avoid constipation. This may include making changes to your diet, such as increasing your intake of fiber or fluid. Taking fiber supplements. These supplements can soften your stool to help make bowel movements easier. Your health care provider may also prescribe a stool softener if your stool is hard. Taking sitz baths. This may help to heal the tear. Using medicated creams or ointments. These may be prescribed to lessen discomfort. Treatments that are sometimes used if initial treatments do not work well or if the condition is more severe may include: Botulinum injection. Surgery to repair the fissure. Follow these instructions at home: Eating and drinking  Avoid foods that may cause constipation, such as bananas, milk, and other dairy products. Eat all fruits, except bananas. Drink enough fluid to keep your urine pale yellow. Eat foods that are high in fiber, such as beans, whole grains, and fresh fruits and vegetables. General instructions  Take over-the-counter and prescription medicines only as told by your health care provider. Use creams or ointments only as told by your health care provider. Keep the anal area clean and dry. Take sitz baths as told by your health care provider. Do not use soap in the sitz baths. Keep all follow-up visits as  told by your health care provider. This is important. Contact a health care provider if you have: More bleeding. A fever. Diarrhea that is mixed with blood. Pain that continues. Ongoing problems that are getting worse rather than better. Summary An anal fissure is a small tear or crack in the tissue of the anus. This condition is usually caused by passing a large or hard stool (feces). Other causes include constipation and frequent diarrhea. Initial treatment for this condition may include taking steps to avoid constipation, such as increasing your intake of fiber or fluid. Follow instructions for care as told by your health care provider. Contact your health care provider if you have more bleeding or your problem is getting worse rather than better. Keep all follow-up visits as told by your health care provider. This is important. This information is not intended to replace advice given to you by your health care provider. Make sure you discuss any questions you have with your health care provider. Document Revised: 02/10/2021 Document Reviewed: 02/10/2021 Elsevier Patient Education  2023 [employer].

## 2022-07-13 ENCOUNTER — Ambulatory Visit (INDEPENDENT_AMBULATORY_CARE_PROVIDER_SITE_OTHER): Payer: Self-pay | Admitting: Internal Medicine

## 2022-07-13 ENCOUNTER — Encounter: Payer: Self-pay | Admitting: Internal Medicine

## 2022-07-13 VITALS — BP 128/76 | HR 72 | Ht 65.0 in | Wt 226.6 lb

## 2022-07-13 DIAGNOSIS — K602 Anal fissure, unspecified: Secondary | ICD-10-CM

## 2022-07-13 DIAGNOSIS — L723 Sebaceous cyst: Secondary | ICD-10-CM

## 2022-07-13 DIAGNOSIS — L089 Local infection of the skin and subcutaneous tissue, unspecified: Secondary | ICD-10-CM

## 2022-07-13 MED ORDER — DOXYCYCLINE HYCLATE 100 MG PO TABS: 100.0000 mg | ORAL_TABLET | Freq: Two times a day (BID) | ORAL | 0 refills | Status: AC

## 2022-07-13 MED ORDER — MUPIROCIN CALCIUM 2 % EX CREA: 1.0000 | TOPICAL_CREAM | Freq: Two times a day (BID) | CUTANEOUS | 0 refills | Status: DC

## 2022-07-13 NOTE — Progress Notes (Signed)
Date:  07/13/2022   Name:  Allison Mccarty   DOB:  11-05-1985   MRN:  [id number]   Chief Complaint: Cyst (Started 1 month ago. Waxing and waning. Sometimes painful. Sometimes it swells. )  Rash This is a new problem. The current episode started 1 to 4 weeks ago. The problem has been waxing and waning since onset. The affected locations include the groin (infected cyst/hair follicle in the pubic area). The rash is characterized by draining, pain and swelling. She was exposed to nothing. Pertinent negatives include no fever or shortness of breath. Past treatments include antibiotic cream. The treatment provided mild relief.    Lab Results  Component Value Date   NA 139 05/31/2021   K 4.5 05/31/2021   CO2 24 (A) 05/31/2021   BUN 8 05/31/2021   CREATININE 0.8 05/31/2021   Lab Results  Component Value Date   CHOL 189 05/31/2021   HDL 62 05/31/2021   LDLCALC 111 05/31/2021   TRIG 82 05/31/2021   Lab Results  Component Value Date   TSH 1.61 05/31/2021   Lab Results  Component Value Date   HGBA1C 5.3 12/20/2021   Lab Results  Component Value Date   WBC 9.0 05/31/2021   HGB 13.4 05/31/2021   HCT 41 05/31/2021   PLT 244 05/31/2021   Lab Results  Component Value Date   ALT 29 05/31/2021   AST 23 05/31/2021   ALKPHOS 84 05/31/2021   No results found for: "25OHVITD2", "25OHVITD3", "VD25OH"   Review of Systems  Constitutional:  Negative for chills and fever.  Respiratory:  Negative for shortness of breath.   Cardiovascular:  Negative for chest pain.  Gastrointestinal:  Negative for abdominal pain.  Skin:  Positive for rash.  Psychiatric/Behavioral:  Negative for dysphoric mood. The patient is not nervous/anxious.     There are no problems to display for this patient.   No Known Allergies  History reviewed. No pertinent surgical history.  Social History   Tobacco Use   Smoking status: Never  Vaping Use   Vaping Use: Never used  Substance Use Topics    Alcohol use: Not Currently   Drug use: Not Currently     Medication list has been reviewed and updated.  No outpatient medications have been marked as taking for the 07/13/22 encounter (Office Visit) with Reubin Milan, MD.       07/13/2022   10:46 AM 12/20/2021    4:34 PM  GAD 7 : Generalized Anxiety Score  Nervous, Anxious, on Edge 0 3  Control/stop worrying 0 3  Worry too much - different things 1 3  Trouble relaxing 0 3  Restless 0 0  Easily annoyed or irritable 1 0  Afraid - awful might happen 0 2  Total GAD 7 Score 2 14  Anxiety Difficulty Not difficult at all Extremely difficult       07/13/2022   10:46 AM 12/20/2021    4:33 PM  Depression screen PHQ 2/9  Decreased Interest 0 1  Down, Depressed, Hopeless 1 3  PHQ - 2 Score 1 4  Altered sleeping 0 3  Tired, decreased energy 0 2  Change in appetite 1 1  Feeling bad or failure about yourself  0 0  Trouble concentrating 0 3  Moving slowly or fidgety/restless 0 0  Suicidal thoughts 0 1  PHQ-9 Score 2 14  Difficult doing work/chores Not difficult at all Somewhat difficult    BP Readings from Last 3 Encounters:  07/13/22 128/76  07/10/22 136/85  07/06/22 109/67    Physical Exam Vitals and nursing note reviewed.  Constitutional:      General: She is not in acute distress.    Appearance: She is well-developed.  HENT:     Head: Normocephalic and atraumatic.  Cardiovascular:     Rate and Rhythm: Normal rate and regular rhythm.  Pulmonary:     Effort: Pulmonary effort is normal. No respiratory distress.  Skin:    General: Skin is warm and dry.     Findings: No rash.          Comments: 1/2 cm cystic lesion with central pore and no drainage or odor.  Mildly tender.  Neurological:     Mental Status: She is alert and oriented to person, place, and time.  Psychiatric:        Mood and Affect: Mood normal.        Behavior: Behavior normal.     Wt Readings from Last 3 Encounters:  07/13/22 226 lb 9.6  oz (102.8 kg)  07/10/22 221 lb (100.2 kg)  07/06/22 220 lb (99.8 kg)    BP 128/76   Pulse 72   Ht 5\' 5"  (1.651 m)   Wt 226 lb 9.6 oz (102.8 kg)   LMP 07/12/2022 (Exact Date)   SpO2 98%   BMI 37.71 kg/m   Assessment and Plan: 1. Infected sebaceous cyst of skin Continue local care Mupirocin topically for recurrent lesions Warm compresses if needed - doxycycline (VIBRA-TABS) 100 MG tablet; Take 1 tablet (100 mg total) by mouth 2 (two) times daily for 10 days.  Dispense: 20 tablet; Refill: 0 - mupirocin cream (BACTROBAN) 2 %; Apply 1 Application topically 2 (two) times daily.  Dispense: 15 g; Refill: 0   Partially dictated using Editor, commissioning. Any errors are unintentional.  Halina Maidens, MD [facility]  07/13/2022

## 2022-07-14 NOTE — Progress Notes (Unsigned)
Patient ID: Allison Mccarty, female   DOB: 30-Mar-1986, 36 y.o.   MRN: [id number]  HPI Allison Mccarty is a 36 y.o. female in consultation at the request of Dr.Wohl .  She reports intermittent pain around the anorectal area that gets worsened with bowel movements.  She also reports some hematochezia.  She describes her pain as sharp moderate to severe intensity.  She also reports that she had traumatic anal intercourse about a year ago and since that time she reports intermittent anorectal pains. Bmp, lfts were nml, she is able to perform more than 4 METS w/o SOB or CP.  HPI  Past Medical History:  Diagnosis Date   Anxiety 2015   Bipolar 1 disorder (HCC)    Depression 2009   Insomnia    Irregular menstrual bleeding     History reviewed. No pertinent surgical history.  Family History  Problem Relation Age of Onset   Breast cancer Mother    Uterine cancer Mother    Heart disease Father    Parkinsonism Father    Ovarian cancer Maternal Aunt    Breast cancer Maternal Aunt    Stroke Maternal Grandmother    Breast cancer Maternal Grandmother     Social History Social History   Tobacco Use   Smoking status: Never  Vaping Use   Vaping Use: Never used  Substance Use Topics   Alcohol use: Not Currently   Drug use: Not Currently    No Known Allergies  Current Outpatient Medications  Medication Sig Dispense Refill   doxycycline (VIBRA-TABS) 100 MG tablet Take 1 tablet (100 mg total) by mouth 2 (two) times daily for 10 days. 20 tablet 0   mupirocin cream (BACTROBAN) 2 % Apply 1 Application topically 2 (two) times daily. 15 g 0   No current facility-administered medications for this visit.     Review of Systems Full ROS  was asked and was negative except for the information on the HPI  Physical Exam Blood pressure 136/85, pulse 75, temperature 98.3 F (36.8 C), temperature source Oral, height 5\' 5"  (1.651 m), weight 221 lb (100.2 kg), SpO2 98 %. CONSTITUTIONAL:  NAD. EYES: Pupils are equal, round,, Sclera are non-icteric. EARS, NOSE, MOUTH AND THROAT: The oropharynx is clear. The oral mucosa is pink and moist. Hearing is intact to voice. LYMPH NODES:  Lymph nodes in the neck are normal. RESPIRATORY:  Lungs are clear. There is normal respiratory effort, with equal breath sounds bilaterally, and without pathologic use of accessory muscles. CARDIOVASCULAR: Heart is regular without murmurs, gallops, or rubs. GI: The abdomen is  soft, nontender, and nondistended. There are no palpable masses. There is no hepatosplenomegaly. There are normal bowel sounds in all quadrants. Rectal: No evidence of an abscess there is no evidence of any rectal masses.  There is evidence of exquisite tenderness to palpation on posterior midline with a fissure.  MUSCULOSKELETAL: Normal muscle strength and tone. No cyanosis or edema.   SKIN: Turgor is good and there are no pathologic skin lesions or ulcers. NEUROLOGIC: Motor and sensation is grossly normal. Cranial nerves are grossly intact. PSYCH:  Oriented to person, place and time. Affect is normal.  Data Reviewed  I have personally reviewed the patient's imaging, laboratory findings and medical records.    Assessment/Plan  36 year old female with classic signs and symptoms consistent with anal fissure.  Discussed with the patient in detail about her disease process.  I Had an extensive discussion regarding fiber, sitz bath's and nifedipine cream.  Also  discussed with her about the role for Botox sphincterotomy.  I will see her back in a couple weeks.  She understands that she will likely require Botox injection. A COPY of this report was sent to the referring provider. Please note that I spent 45 minutes in this encounter including personally reviewing imaging studies, coordinating her care, placing orders and performing appropriate documentation.     Sterling Big, MD FACS General Surgeon 07/14/2022, 4:28 PM

## 2022-07-17 ENCOUNTER — Encounter: Payer: Self-pay | Admitting: Internal Medicine

## 2022-07-19 ENCOUNTER — Encounter: Payer: Self-pay | Admitting: Internal Medicine

## 2022-07-19 ENCOUNTER — Ambulatory Visit (INDEPENDENT_AMBULATORY_CARE_PROVIDER_SITE_OTHER): Payer: Self-pay | Admitting: Internal Medicine

## 2022-07-19 VITALS — BP 116/76 | HR 78 | Ht 65.0 in | Wt 223.0 lb

## 2022-07-19 DIAGNOSIS — B9689 Other specified bacterial agents as the cause of diseases classified elsewhere: Secondary | ICD-10-CM

## 2022-07-19 DIAGNOSIS — N760 Acute vaginitis: Secondary | ICD-10-CM

## 2022-07-19 DIAGNOSIS — Z202 Contact with and (suspected) exposure to infections with a predominantly sexual mode of transmission: Secondary | ICD-10-CM

## 2022-07-19 LAB — POCT WET PREP WITH KOH
KOH Prep POC: NEGATIVE
RBC Wet Prep HPF POC: 0
Trichomonas, UA: NEGATIVE

## 2022-07-19 MED ORDER — METRONIDAZOLE 500 MG PO TABS: 500.0000 mg | ORAL_TABLET | Freq: Two times a day (BID) | ORAL | 0 refills | Status: AC

## 2022-07-19 NOTE — Progress Notes (Signed)
Date:  07/19/2022   Name:  Allison Mccarty   DOB:  07/03/1986   MRN:  [id number]   Chief Complaint: Std testing and Vaginitis (X 4-5 days,Burning, itching, was having discharge clumpy, rash on the outside of vagina, tried monistat helped some)  Vaginal Itching The patient's primary symptoms include genital itching and vaginal discharge. The patient's pertinent negatives include no genital lesions or vaginal bleeding. This is a new problem. The current episode started in the past 7 days. The problem has been unchanged. The patient is experiencing no pain. Pertinent negatives include no chills. She has tried antifungals for the symptoms. The treatment provided mild relief.  She is somewhat concerned too that she may have a retained tampon.  Lab Results  Component Value Date   NA 139 05/31/2021   K 4.5 05/31/2021   CO2 24 (A) 05/31/2021   BUN 8 05/31/2021   CREATININE 0.8 05/31/2021   Lab Results  Component Value Date   CHOL 189 05/31/2021   HDL 62 05/31/2021   LDLCALC 111 05/31/2021   TRIG 82 05/31/2021   Lab Results  Component Value Date   TSH 1.61 05/31/2021   Lab Results  Component Value Date   HGBA1C 5.3 12/20/2021   Lab Results  Component Value Date   WBC 9.0 05/31/2021   HGB 13.4 05/31/2021   HCT 41 05/31/2021   PLT 244 05/31/2021   Lab Results  Component Value Date   ALT 29 05/31/2021   AST 23 05/31/2021   ALKPHOS 84 05/31/2021   No results found for: "25OHVITD2", "25OHVITD3", "VD25OH"   Review of Systems  Constitutional:  Negative for chills and fatigue.  Respiratory:  Negative for chest tightness and shortness of breath.   Cardiovascular:  Negative for chest pain.  Genitourinary:  Positive for vaginal discharge.  Psychiatric/Behavioral:  Negative for dysphoric mood and sleep disturbance. The patient is not nervous/anxious.     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 No pertinent surgical history.  Social History   Tobacco Use   Smoking status: Never  Vaping Use   Vaping Use: Never used  Substance Use Topics   Alcohol use: Not Currently   Drug use: Not Currently     Medication list has been reviewed and updated.  Current Meds  Medication Sig   doxycycline (VIBRA-TABS) 100 MG tablet Take 1 tablet (100 mg total) by mouth 2 (two) times daily for 10 days.   metroNIDAZOLE (FLAGYL) 500 MG tablet Take 1 tablet (500 mg total) by mouth 2 (two) times daily for 7 days.   mupirocin cream (BACTROBAN) 2 % Apply 1 Application topically 2 (two) times daily.   nifedipine 0.3 % ointment Place 1 Application rectally 4 (four) times daily.       07/19/2022   10:24 AM 07/13/2022   10:46 AM 12/20/2021    4:34 PM  GAD 7 : Generalized Anxiety Score  Nervous, Anxious, on Edge 0 0 3  Control/stop worrying 1 0 3  Worry too much - different things 1 1 3   Trouble relaxing 0 0 3  Restless 0 0 0  Easily annoyed or irritable 0 1 0  Afraid - awful might happen 0 0 2  Total GAD 7 Score 2 2 14   Anxiety Difficulty Not difficult at all Not difficult at all Extremely difficult       07/19/2022   10:24 AM 07/13/2022  10:46 AM 12/20/2021    4:33 PM  Depression screen PHQ 2/9  Decreased Interest 0 0 1  Down, Depressed, Hopeless 0 1 3  PHQ - 2 Score 0 1 4  Altered sleeping 0 0 3  Tired, decreased energy 0 0 2  Change in appetite 0 1 1  Feeling bad or failure about yourself  0 0 0  Trouble concentrating 1 0 3  Moving slowly or fidgety/restless 0 0 0  Suicidal thoughts 0 0 1  PHQ-9 Score 1 2 14   Difficult doing work/chores Not difficult at all Not difficult at all Somewhat difficult    BP Readings from Last 3 Encounters:  07/19/22 116/76  07/13/22 128/76  07/10/22 136/85    Physical Exam Vitals and nursing note reviewed.  Constitutional:      General: She is not in acute distress.    Appearance: Normal appearance. She is well-developed.  HENT:      Head: Normocephalic and atraumatic.  Cardiovascular:     Rate and Rhythm: Normal rate and regular rhythm.  Pulmonary:     Effort: Pulmonary effort is normal. No respiratory distress.     Breath sounds: No wheezing or rhonchi.  Abdominal:     General: Abdomen is flat.     Palpations: Abdomen is soft.  Genitourinary:    Vagina: No tenderness.     Cervix: Normal.     Comments: No vaginal foreign body Musculoskeletal:     Cervical back: Normal range of motion.  Lymphadenopathy:     Cervical: No cervical adenopathy.  Skin:    General: Skin is warm and dry.     Findings: No rash.  Neurological:     Mental Status: She is alert and oriented to person, place, and time.  Psychiatric:        Mood and Affect: Mood normal.        Behavior: Behavior normal.     Wt Readings from Last 3 Encounters:  07/19/22 223 lb (101.2 kg)  07/13/22 226 lb 9.6 oz (102.8 kg)  07/10/22 221 lb (100.2 kg)    BP 116/76   Pulse 78   Ht 5\' 5"  (1.651 m)   Wt 223 lb (101.2 kg)   LMP 07/12/2022 (Exact Date)   SpO2 97%   BMI 37.11 kg/m   Assessment and Plan: 1. BV (bacterial vaginosis) No evidence of retained tampon Stop Monistat Use Flagyl bid - partner does not need testing - POCT Wet Prep with KOH - metroNIDAZOLE (FLAGYL) 500 MG tablet; Take 1 tablet (500 mg total) by mouth 2 (two) times daily for 7 days.  Dispense: 14 tablet; Refill: 0  2. Exposure to sexually transmitted disease (STD) - Chlamydia/Gonococcus/Trichomonas, NAA - HIV Antibody (routine testing w rflx)   Partially dictated using . Any errors are unintentional.  07/14/2022, MD [facility]  07/19/2022

## 2022-07-20 LAB — HIV ANTIBODY (ROUTINE TESTING W REFLEX): HIV Screen 4th Generation wRfx: NONREACTIVE

## 2022-07-24 LAB — CHLAMYDIA/GONOCOCCUS/TRICHOMONAS, NAA
Chlamydia by NAA: NEGATIVE
Gonococcus by NAA: NEGATIVE
Trich vag by NAA: NEGATIVE

## 2022-08-01 NOTE — Telephone Encounter (Signed)
Please review.  KP

## 2022-08-02 ENCOUNTER — Ambulatory Visit: Payer: Self-pay | Admitting: Internal Medicine

## 2022-08-02 ENCOUNTER — Ambulatory Visit: Payer: Self-pay | Admitting: Surgery

## 2022-08-16 ENCOUNTER — Ambulatory Visit (INDEPENDENT_AMBULATORY_CARE_PROVIDER_SITE_OTHER): Payer: Self-pay | Admitting: Surgery

## 2022-08-16 ENCOUNTER — Encounter: Payer: Self-pay | Admitting: Surgery

## 2022-08-16 VITALS — BP 105/63 | HR 56 | Temp 98.2°F | Ht 65.0 in | Wt 216.0 lb

## 2022-08-16 DIAGNOSIS — K602 Anal fissure, unspecified: Secondary | ICD-10-CM

## 2022-08-16 NOTE — Patient Instructions (Signed)
Our surgery scheduler Pamala Hurry will call you within 24-48 hours to get you scheduled. If you have not heard from her after 48 hours, please call our office. Have the blue sheet available when she calls to write down important information.   If you have any concerns or questions, please feel free to call our office.   Anal Fissure, Adult  An anal fissure is a small tear or crack in the tissue around the opening of the butt (anus). Bleeding from the tear or crack usually stops on its own within a few minutes. The bleeding may happen every time you poop (have a bowel movement) until the tear or crack heals. What are the causes? This condition is usually caused by passing a large or hard poop (stool). Other causes include: Trouble pooping (constipation). Passing watery poop (diarrhea). Inflammatory bowel disease (Crohn's disease or ulcerative colitis). Childbirth. Infections. Anal sex. What are the signs or symptoms? Symptoms of this condition include: Bleeding from the butt. Small amounts of blood on your poop. The blood coats the outside of the poop. It is not mixed with the poop. Small amounts of blood on the toilet paper or in the toilet after you poop. Pain when passing poop. Itching or irritation around the opening of the butt. How is this diagnosed? This condition may be diagnosed based on a physical exam. Your doctor may: Check your butt. A tear can often be seen by checking the area with care. Check your butt using a short tube (anoscope). The light in the tube will show any problems in your butt. How is this treated? Treatment for this condition may include: Treating problems that make it hard for you to pass poop. You may be told to: Eat more fiber. Drink more fluid. Take fiber supplements. Take medicines that make poop soft. Taking warm water baths (sitz baths). This may help to heal the tear. Using creams and ointments. If your condition gets worse, other treatments may be  needed such as: A shot near the tear or crack (botulinum injection). Surgery to repair the tear or crack. Follow these instructions at home: Eating and drinking  Avoid bananas and dairy products. These foods can make it hard to poop. Drink enough fluid to keep your pee (urine) pale yellow. Eat foods that have a lot of fiber in them, such as: Beans. Whole grains. Fresh fruits. Fresh vegetables. General instructions  Take over-the-counter and prescription medicines only as told by your doctor. Use creams or ointments only as told by your doctor. Keep the butt area as clean and dry as you can. Take a warm water bath as told by your doctor. Do not use soap. Keep all follow-up visits as told by your doctor. This is important. Contact a doctor if: You have more bleeding. You have a fever. You have watery poop that is mixed with blood. You have pain. Your problem gets worse, not better. Summary An anal fissure is a small tear or crack in the skin around the opening of the butt (anus). This condition is usually caused by passing a large or hard poop (stool). Treatment includes treating the problems that make it hard for you to pass poop. Follow your doctor's instructions about caring for your condition at home. Keep all follow-up visits as told by your doctor. This is important. This information is not intended to replace advice given to you by your health care provider. Make sure you discuss any questions you have with your health care provider. Document  Revised: 02/10/2021 Document Reviewed: 02/10/2021 Elsevier Patient Education  [place].

## 2022-08-22 NOTE — Progress Notes (Signed)
  Surgical Consultation  08/22/2022  Allison Mccarty is an 37 y.o. female.   Chief Complaint  Patient presents with   Follow-up    Anal fissure     [id number] Allison Mccarty is a 37 y.o. female following for a fissure .  She reports intermittent pain around the anorectal area but has improved significantly..  She also reports some hematochezia.  Doing sitz baths and nifedipine cream.   Past Medical History:  Diagnosis Date   Anxiety 2015   Bipolar 1 disorder ([place])    Depression 2009   Insomnia    Irregular menstrual bleeding     History reviewed. No pertinent surgical history.  Family History  Problem Relation Age of Onset   Breast cancer Mother    Uterine cancer Mother    Heart disease Father    Parkinsonism Father    Ovarian cancer Maternal Aunt    Breast cancer Maternal Aunt    Stroke Maternal Grandmother    Breast cancer Maternal Grandmother     Social History:  reports that she has never smoked. She does not have any smokeless tobacco history on file. She reports that she does not currently use alcohol. She reports that she does not currently use drugs.  Allergies: No Known Allergies  Medications reviewed.     ROS Full ROS performed and is otherwise negative other than what is stated in the HPI    BP 105/63   Pulse (!) 56   Temp 98.2 F (36.8 C) (Oral)   Ht 5\' 5"  (1.651 m)   Wt 216 lb (98 kg)   SpO2 97%   BMI 35.94 kg/m   Physical Exam CONSTITUTIONAL: NAD. EYES: Pupils are equal, round,, Sclera are non-icteric. EARS, NOSE, MOUTH AND THROAT: The oropharynx is clear. The oral mucosa is pink and moist. Hearing is intact to voice. LYMPH NODES:  Lymph nodes in the neck are normal. RESPIRATORY:  Lungs are clear. There is normal respiratory effort, with equal breath sounds bilaterally, and without pathologic use of accessory muscles. CARDIOVASCULAR: Heart is regular without murmurs, gallops, or rubs. GI: The abdomen is  soft, nontender, and  nondistended. There are no palpable masses. There is no hepatosplenomegaly. There are normal bowel sounds in all quadrants. Rectal: No evidence of an abscess ,there is no evidence of any rectal masses.  There is evidence of tenderness to palpation, posterior midline with a fissure  improvement.   MUSCULOSKELETAL: Normal muscle strength and tone. No cyanosis or edema.   SKIN: Turgor is good and there are no pathologic skin lesions or ulcers. NEUROLOGIC: Motor and sensation is grossly normal. Cranial nerves are grossly intact. PSYCH:  Oriented to person, place and time. Affect is normal.   Assessment/Plan: .  37 year old female with anal fissure partially responsive to medical therapy.  Discussed with patient in detail.  Although her symptoms have improved significantly is not completely healed.  Discussed with him about options of chemical sphincterotomy with Botox.  Currently patient is hesitant given some financial constraints.  I Discussed with her the risks, the benefits and the possible complications.  She will call us back when she is ready.  Please note that I spent 30 minutes in this encounter including personally reviewing imaging studies, coordinating her care and performing appropriate documentation  Caroleen Hamman, MD [place]

## 2022-08-28 ENCOUNTER — Encounter: Payer: Self-pay | Admitting: Emergency Medicine

## 2022-08-28 ENCOUNTER — Emergency Department
Admission: EM | Admit: 2022-08-28 | Discharge: 2022-08-28 | Disposition: A | Discharge status: Home / Self Care | Payer: Self-pay | Attending: Emergency Medicine | Admitting: Emergency Medicine

## 2022-08-28 ENCOUNTER — Ambulatory Visit: Payer: Self-pay

## 2022-08-28 ENCOUNTER — Other Ambulatory Visit: Payer: Self-pay

## 2022-08-28 ENCOUNTER — Ambulatory Visit: Payer: Self-pay | Admitting: Internal Medicine

## 2022-08-28 DIAGNOSIS — K602 Anal fissure, unspecified: Secondary | ICD-10-CM | POA: Insufficient documentation

## 2022-08-28 DIAGNOSIS — R21 Rash and other nonspecific skin eruption: Secondary | ICD-10-CM | POA: Insufficient documentation

## 2022-08-28 DIAGNOSIS — N390 Urinary tract infection, site not specified: Secondary | ICD-10-CM | POA: Insufficient documentation

## 2022-08-28 LAB — CBC WITH DIFFERENTIAL/PLATELET
Abs Immature Granulocytes: 0.02 10*3/uL (ref 0.00–0.07)
Basophils Absolute: 0 10*3/uL (ref 0.0–0.1)
Basophils Relative: 1 %
Eosinophils Absolute: 0 10*3/uL (ref 0.0–0.5)
Eosinophils Relative: 0 %
HCT: 41.5 % (ref 36.0–46.0)
Hemoglobin: 13.6 g/dL (ref 12.0–15.0)
Immature Granulocytes: 0 %
Lymphocytes Relative: 17 %
Lymphs Abs: 1.5 10*3/uL (ref 0.7–4.0)
MCH: 29.1 pg (ref 26.0–34.0)
MCHC: 32.8 g/dL (ref 30.0–36.0)
MCV: 88.9 fL (ref 80.0–100.0)
Monocytes Absolute: 0.5 10*3/uL (ref 0.1–1.0)
Monocytes Relative: 6 %
Neutro Abs: 6.5 10*3/uL (ref 1.7–7.7)
Neutrophils Relative %: 76 %
Platelets: 184 10*3/uL (ref 150–400)
RBC: 4.67 MIL/uL (ref 3.87–5.11)
RDW: 12.7 % (ref 11.5–15.5)
WBC: 8.6 10*3/uL (ref 4.0–10.5)
nRBC: 0 % (ref 0.0–0.2)

## 2022-08-28 LAB — URINALYSIS, ROUTINE W REFLEX MICROSCOPIC
Bilirubin Urine: NEGATIVE
Glucose, UA: NEGATIVE mg/dL
Ketones, ur: NEGATIVE mg/dL
Nitrite: POSITIVE — AB
Protein, ur: NEGATIVE mg/dL
Specific Gravity, Urine: 1.01 (ref 1.005–1.030)
pH: 6 (ref 5.0–8.0)

## 2022-08-28 LAB — WET PREP, GENITAL
Clue Cells Wet Prep HPF POC: NONE SEEN
Sperm: NONE SEEN
Trich, Wet Prep: NONE SEEN
WBC, Wet Prep HPF POC: <10 (ref <10)
Yeast Wet Prep HPF POC: NONE SEEN

## 2022-08-28 LAB — COMPREHENSIVE METABOLIC PANEL
ALT: 23 U/L (ref 0–44)
AST: 21 U/L (ref 15–41)
Albumin: 4.1 g/dL (ref 3.5–5.0)
Alkaline Phosphatase: 70 U/L (ref 38–126)
Anion gap: 9 (ref 5–15)
BUN: 9 mg/dL (ref 6–20)
CO2: 24 mmol/L (ref 22–32)
Calcium: 8.8 mg/dL — ABNORMAL LOW (ref 8.9–10.3)
Chloride: 102 mmol/L (ref 98–111)
Creatinine, Ser: 0.66 mg/dL (ref 0.44–1.00)
GFR, Estimated: >60 mL/min (ref >60)
Glucose, Bld: 98 mg/dL (ref 70–99)
Potassium: 3.6 mmol/L (ref 3.5–5.1)
Sodium: 135 mmol/L (ref 135–145)
Total Bilirubin: 0.6 mg/dL (ref 0.3–1.2)
Total Protein: 7 g/dL (ref 6.5–8.1)

## 2022-08-28 LAB — CHLAMYDIA/NGC RT PCR (ARMC ONLY)
Chlamydia Tr: NOT DETECTED
N gonorrhoeae: NOT DETECTED

## 2022-08-28 MED ORDER — CEPHALEXIN 500 MG PO CAPS: 1000.0000 mg | ORAL_CAPSULE | Freq: Two times a day (BID) | ORAL | 0 refills | Status: AC

## 2022-08-28 MED ORDER — OXYCODONE-ACETAMINOPHEN 5-325 MG PO TABS: 1.0000 | ORAL_TABLET | Freq: Three times a day (TID) | ORAL | 0 refills | Status: AC | PRN

## 2022-08-28 MED ORDER — LIDOCAINE-HYDROCORTISONE ACE 2.8-0.55 % RE GEL: 1.0000 | Freq: Two times a day (BID) | RECTAL | 0 refills | Status: DC

## 2022-08-28 MED ORDER — OXYCODONE-ACETAMINOPHEN 5-325 MG PO TABS
1.0000 | ORAL_TABLET | Freq: Once | ORAL | Status: AC
  Administered 2022-08-28: 1 via ORAL
  Filled 2022-08-28: qty 1

## 2022-08-28 NOTE — Discharge Instructions (Addendum)
-  For your anal fissures, you may continue your nifedipine ointment.  In addition, you may utilize the lidocaine/hydrocortisone ointment as well.  For the pain, you may take oxycodone/acetaminophen as well.  I recommend a full liquid diet for the next few days (soups, Jell-O's, etc.) please follow-up with the surgeon as discussed for reevaluation.  -You are found to have a urinary tract infection as well.  Please take the full course of the cephalexin as prescribed.  -Return to the emergency department anytime if you begin to experience any new or worsening symptoms.

## 2022-08-28 NOTE — Telephone Encounter (Signed)
   Chief Complaint: Rectal pain from fissure Symptoms: Pain  Frequency: Weekend Pertinent Negatives: Patient denies fever Disposition: [] ED /[] Urgent Care (no appt availability in office) / [x] Appointment(In office/virtual)/ []  [facility]/ [] Home Care/ [] Refused Recommended Disposition /[] [place]/ [x]  Follow-up with PCP Additional Notes:    Answer Assessment - Initial Assessment Questions 1. SYMPTOM:  "What's the main symptom you're concerned about?" (e.g., pain, itching, swelling, rash)     Pian 2. ONSET: "When did the   start?"     Thursday 3. RECTAL PAIN: "Do you have any pain around your rectum?" "How bad is the pain?"  (Scale 0-10; or mild, moderate, severe)   - NONE (0): no pain   - MILD (1-3): doesn't interfere with normal activities    - MODERATE (4-7): interferes with normal activities or awakens from sleep, limping    - SEVERE (8-10): excruciating pain, unable to have a bowel movement      10 4. RECTAL ITCHING: "Do you have any itching in this area?" "How bad is the itching?"  (Scale 0-10; or mild, moderate, severe)   - NONE: no itching   - MILD: doesn't interfere with normal activities    - MODERATE-SEVERE: interferes with normal activities or awakens from sleep     NO 5. CONSTIPATION: "Do you have constipation?" If Yes, ask: "How bad is it?"     nO 6. CAUSE: "What do you think is causing the anus symptoms?"     Fissure 7. OTHER SYMPTOMS: "Do you have any other symptoms?"  (e.g., abdomen pain, fever, rectal bleeding, vomiting)     Pain 8. PREGNANCY: "Is there any chance you are pregnant?" "When was your last menstrual period?"     no  Protocols used: Rectal Symptoms-A-AH

## 2022-08-28 NOTE — ED Provider Notes (Signed)
[facility] Provider Note    Event Date/Time   First MD Initiated Contact with Patient 08/28/22 1220     (approximate)   History   Chief Complaint Rash   HPI Allison Mccarty is a 37 y.o. female, history of anxiety, depression, bipolar 1, insomnia, presents to the emergency department for evaluation of anal fissure/rash.  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  She states that overall it has been improving, however this past week, she was in a hot tub and additionally engaged in sexual intercourse, which has reportedly caused a worsening of her fissure.  She says that she feels like she has a urinary tract infection as well.  Denies chest pain, shortness of breath, vaginal bleeding/discharge, abdominal pain, nausea/vomiting, night diarrhea, hematuria, headache, weakness, or dizziness/lightheadedness.  History Limitations: No limitations.        Physical Exam  Triage Vital Signs: ED Triage Vitals [08/28/22 1158]  Enc Vitals Group     BP      Pulse      Resp      Temp      Temp src      SpO2      Weight 215 lb 15.8 oz (98 kg)     Height 5\' 5"  (1.651 m)     Head Circumference      Peak Flow      Pain Score      Pain Loc      Pain Edu?      Excl. in [place]?     Most recent vital signs: Vitals:   08/28/22 1348 08/28/22 1623  BP: 123/73 128/82  Pulse: 82 72  Resp: 16 16  Temp: 97.9 F (36.6 C)   SpO2: 99% 99%    General: Awake, appears very uncomfortable.  Found sitting on her hands and knees, as this is the most comfortable position for her. Skin: Warm, dry. No rashes or lesions.  Eyes: PERRL. Conjunctivae normal.  CV: Good peripheral perfusion.  Resp: Normal effort.  Abd: Soft, non-tender. No distention.  Neuro: At baseline. No gross neurological deficits.  Musculoskeletal: Normal ROM of all extremities.  Focused Exam: 1.5  cm anal fissure at the 12:00 region, as well as a 2 cm anal fissure at the 6:00 region.  Appears fairly superficial.  No active bleeding or discharge.  There does appear to be some mild surrounding erythema.  Notably tender to palpation.  There does appear to be a "skin tag" along the perirectal region, which patient is aware of.  Physical Exam    ED Results / Procedures / Treatments  Labs (all labs ordered are listed, but only abnormal results are displayed) Labs Reviewed  COMPREHENSIVE METABOLIC PANEL - Abnormal; Notable for the following components:      Result Value   Calcium 8.8 (*)    All other components within normal limits  URINALYSIS, ROUTINE W REFLEX MICROSCOPIC - Abnormal; Notable for the following components:   Color, Urine AMBER (*)    APPearance HAZY (*)    Hgb urine dipstick MODERATE (*)    Nitrite POSITIVE (*)    Leukocytes,Ua MODERATE (*)    Bacteria, UA FEW (*)    All other components within normal limits  WET PREP, GENITAL  CHLAMYDIA/NGC RT PCR ([facility] ONLY)            CBC WITH DIFFERENTIAL/PLATELET  EKG N/A.    RADIOLOGY  ED Provider Interpretation: N/A.  No results found.  PROCEDURES:  Critical Care performed: N/A.  Procedures    MEDICATIONS ORDERED IN ED: Medications  oxyCODONE-acetaminophen (PERCOCET/ROXICET) 5-325 MG per tablet 1 tablet (1 tablet Oral Given 08/28/22 1349)  oxyCODONE-acetaminophen (PERCOCET/ROXICET) 5-325 MG per tablet 1 tablet (1 tablet Oral Given 08/28/22 1553)     IMPRESSION / MDM / ASSESSMENT AND PLAN / ED COURSE  I reviewed the triage vital signs and the nursing notes.                              Differential diagnosis includes, but is not limited to, anal fissure, hemorrhoid, cellulitis/abscess, vaginal candidiasis, chlamydia/gonorrhea, urinary tract infection, trichomoniasis, bacterial vaginosis.   ED Course Patient appears clinically stable, though quite uncomfortable.  Will treat with oxycodone and  acetaminophen.  CBC shows no leukocytosis or anemia.  CMP shows no electrolyte abnormalities, transaminitis, or AKI.  Wet prep shows no evidence of trichinosis, bacterial vaginosis, candidiasis.  Chlamydia/gonorrhea screening negative.  Assessment/Plan Presentation consistent with anal fissures, notably at the 12 o'clock and 6 o'clock position.  She is currently already on nifedipine cream.  Will add lidocaine/hydrocortisone ointment as well.  Encouraged her to restrict to a full liquid diet for the next few days and to reinitiate high-fiber diet with docusate.  Recommended Metamucil as well to prevent constipation, as this has been an issue for her.  Strongly encouraged her to follow through with her appointment with general surgery, which is projected to be scheduled soon.  Lab workup is reassuring, though she was found to have a urinary tract infection.  Will provide her with a prescription for cephalexin, at a dosage that will also cover for possible soft tissue infection around the anal fissures given the surrounding tenderness and erythema.  Provide her with a short course of oxycodone/acetaminophen as well.  Encouraged sitz bath's and rest.  She was amenable to this plan.  Will discharge.  Provided the patient with anticipatory guidance, return precautions, and educational material. Encouraged the patient to return to the emergency department at any time if they begin to experience any new or worsening symptoms. Patient expressed understanding and agreed with the plan.   Patient's presentation is most consistent with acute complicated illness / injury requiring diagnostic workup.       FINAL CLINICAL IMPRESSION(S) / ED DIAGNOSES   Final diagnoses:  Anal fissure  Urinary tract infection without hematuria, site unspecified     Rx / DC Orders   ED Discharge Orders          Ordered    Lidocaine-Hydrocortisone Ace 2.8-0.55 % GEL  2 times daily        08/28/22 1554     oxyCODONE-acetaminophen (PERCOCET) 5-325 MG tablet  Every 8 hours PRN        08/28/22 1554    cephALEXin (KEFLEX) 500 MG capsule  2 times daily        08/28/22 1554             Note:  This document was prepared using Dragon voice recognition software and may include unintentional dictation errors.   Teodoro Spray, [place] 08/28/22 Cherlynn Polo, MD 08/29/22 [phone]

## 2022-08-29 ENCOUNTER — Ambulatory Visit: Payer: Self-pay

## 2022-08-29 ENCOUNTER — Telehealth: Payer: Self-pay

## 2022-08-29 NOTE — Telephone Encounter (Signed)
Patient called complaining of very bad pain and rectal swelling from anal fissure and inability to have a bowel movement. She reports her last bowel movement was Sunday and it was only a very small amount and very hard. She was seen at the ER yesterday and given pain medication and Lidocaine-Hydrocortisone cream. Consulted with Dr Christian Mate and he suggests that the patient needs to address her bowel problem as this seems to be contributing to her pain and swelling. Patient instructed to use Magnesium Citrate liquid and drink the full bottle to help her evacuate her bowels. Then she may be able to start using her prescribed medication to get some pain relief. Patient also instructed to continue with the sitz baths especially after having a bowel movement.

## 2022-08-29 NOTE — Telephone Encounter (Signed)
  Chief Complaint: Severe rectal pain. Tear from rectum to vagina - unable to have  a bm Symptoms: above Frequency:  Pertinent Negatives: Patient denies  Disposition: [x] ED /[] Urgent Care (no appt availability in office) / [] Appointment(In office/virtual)/ []  [facility]/ [] Home Care/ [] Refused Recommended Disposition /[] [facility]/ []  Follow-up with PCP Additional Notes: Pt called - she is crying in pain. She also sounds very anxious. Pt states that pain of 10/10. She states that everything is swollen, and she is now "open" from her rectum to her vagina. Pain medication is not working.  PT will be taken to ED.    Reason for Disposition  SEVERE rectal pain (e.g., excruciating, unable to have a bowel movement)  Patient sounds very sick or weak to the triager  Answer Assessment - Initial Assessment Questions 1. SYMPTOM:  "What's the main symptom you're concerned about?" (e.g., pain, itching, swelling, rash)     Swollen, open from vagina to rectum. Unable to have a BM 2. ONSET: "When did the problem  start?"     ongoing 3. RECTAL PAIN: "Do you have any pain around your rectum?" "How bad is the pain?"  (Scale 0-10; or mild, moderate, severe)   - NONE (0): no pain   - MILD (1-3): doesn't interfere with normal activities    - MODERATE (4-7): interferes with normal activities or awakens from sleep, limping    - SEVERE (8-10): excruciating pain, unable to have a bowel movement      10/10 4. RECTAL ITCHING: "Do you have any itching in this area?" "How bad is the itching?"  (Scale 0-10; or mild, moderate, severe)   - NONE: no itching   - MILD: doesn't interfere with normal activities    - MODERATE-SEVERE: interferes with normal activities or awakens from sleep      5. CONSTIPATION: "Do you have constipation?" If Yes, ask: "How bad is it?"     yes 6. CAUSE: "What do you think is causing the anus symptoms?"     Anal fissure 7. OTHER SYMPTOMS: "Do you have any other  symptoms?"  (e.g., abdomen pain, fever, rectal bleeding, vomiting)      8. PREGNANCY: "Is there any chance you are pregnant?" "When was your last menstrual period?"  Protocols used: Rectal Symptoms-A-AH

## 2022-08-30 ENCOUNTER — Encounter: Payer: Self-pay | Admitting: Internal Medicine

## 2022-08-30 ENCOUNTER — Other Ambulatory Visit: Payer: Self-pay | Admitting: Internal Medicine

## 2022-08-30 ENCOUNTER — Ambulatory Visit (INDEPENDENT_AMBULATORY_CARE_PROVIDER_SITE_OTHER): Payer: Self-pay | Admitting: Internal Medicine

## 2022-08-30 VITALS — BP 136/72 | HR 103 | Ht 65.0 in | Wt 209.0 lb

## 2022-08-30 DIAGNOSIS — R21 Rash and other nonspecific skin eruption: Secondary | ICD-10-CM

## 2022-08-30 DIAGNOSIS — R234 Changes in skin texture: Secondary | ICD-10-CM

## 2022-08-30 DIAGNOSIS — L739 Follicular disorder, unspecified: Secondary | ICD-10-CM

## 2022-08-30 MED ORDER — MUPIROCIN CALCIUM 2 % EX CREA: 1.0000 | TOPICAL_CREAM | Freq: Two times a day (BID) | CUTANEOUS | 0 refills | Status: DC

## 2022-08-30 MED ORDER — SULFAMETHOXAZOLE-TRIMETHOPRIM 800-160 MG PO TABS: 1.0000 | ORAL_TABLET | Freq: Two times a day (BID) | ORAL | 0 refills | Status: AC

## 2022-08-30 MED ORDER — VALACYCLOVIR HCL 1 G PO TABS: 1000.0000 mg | ORAL_TABLET | Freq: Three times a day (TID) | ORAL | 0 refills | Status: AC

## 2022-08-30 NOTE — Progress Notes (Signed)
Date:  08/30/2022   Name:  Allison Mccarty   DOB:  Dec 07, 1985   MRN:  [id number]   Chief Complaint: No chief complaint on file.  Rash This is a new problem. The current episode started in the past 7 days. The problem has been gradually worsening since onset. The affected locations include the genitalia. The rash is characterized by blistering, burning, redness and swelling. Pertinent negatives include no fatigue, fever or shortness of breath.  Urinary Tract Infection  This is a new problem. The current episode started in the past 7 days. The problem has been unchanged. Pertinent negatives include no chills. Treatments tried: keflex. she has no history of Herpes infection  Skin tears - above and below the rectum.  Using lidocaine with nifedipine on the tears as well as in the rectum for previously diagnosed fissure. She was seen in the ED - negative swab for GC and Chlamydia.  Wet prep negative. UA positive.  No HSV testing.   Lab Results  Component Value Date   NA 135 08/28/2022   K 3.6 08/28/2022   CO2 24 08/28/2022   GLUCOSE 98 08/28/2022   BUN 9 08/28/2022   CREATININE 0.66 08/28/2022   CALCIUM 8.8 (L) 08/28/2022   GFRNONAA >60 08/28/2022   Lab Results  Component Value Date   CHOL 189 05/31/2021   HDL 62 05/31/2021   LDLCALC 111 05/31/2021   TRIG 82 05/31/2021   Lab Results  Component Value Date   TSH 1.61 05/31/2021   Lab Results  Component Value Date   HGBA1C 5.3 12/20/2021   Lab Results  Component Value Date   WBC 8.6 08/28/2022   HGB 13.6 08/28/2022   HCT 41.5 08/28/2022   MCV 88.9 08/28/2022   PLT 184 08/28/2022   Lab Results  Component Value Date   ALT 23 08/28/2022   AST 21 08/28/2022   ALKPHOS 70 08/28/2022   BILITOT 0.6 08/28/2022   No results found for: "25OHVITD2", "25OHVITD3", "VD25OH"   Review of Systems  Constitutional:  Negative for chills, fatigue and fever.  Respiratory:  Negative for chest tightness and shortness of breath.    Gastrointestinal:  Positive for rectal pain.  Genitourinary:  Positive for difficulty urinating, dysuria, genital sores and vaginal pain.  Skin:  Positive for rash.    Patient Active Problem List   Diagnosis Date Noted   Rectal fissure 07/13/2022    No Known Allergies  History reviewed. No pertinent surgical history.  Social History   Tobacco Use   Smoking status: Never  Vaping Use   Vaping Use: Never used  Substance Use Topics   Alcohol use: Not Currently   Drug use: Not Currently     Medication list has been reviewed and updated.  Current Meds  Medication Sig   cephALEXin (KEFLEX) 500 MG capsule Take 2 capsules (1,000 mg total) by mouth 2 (two) times daily for 7 days.   Lidocaine-Hydrocortisone Ace 2.8-0.55 % GEL Place 1 Application rectally 2 (two) times daily.   mupirocin cream (BACTROBAN) 2 % Apply 1 Application topically 2 (two) times daily. To buttock lesion   nifedipine 0.3 % ointment Place 1 Application rectally 4 (four) times daily.   oxyCODONE-acetaminophen (PERCOCET) 5-325 MG tablet Take 1-2 tablets by mouth every 8 (eight) hours as needed for up to 7 days for severe pain.   sulfamethoxazole-trimethoprim (BACTRIM DS) 800-160 MG tablet Take 1 tablet by mouth 2 (two) times daily for 10 days.   valACYclovir (VALTREX) 1000 MG  tablet Take 1 tablet (1,000 mg total) by mouth 3 (three) times daily for 10 days.   [DISCONTINUED] mupirocin cream (BACTROBAN) 2 % Apply 1 Application topically 2 (two) times daily.       08/30/2022   11:16 AM 07/19/2022   10:24 AM 07/13/2022   10:46 AM 12/20/2021    4:34 PM  GAD 7 : Generalized Anxiety Score  Nervous, Anxious, on Edge 0 0 0 3  Control/stop worrying 0 1 0 3  Worry too much - different things 0 1 1 3   Trouble relaxing 0 0 0 3  Restless 0 0 0 0  Easily annoyed or irritable 0 0 1 0  Afraid - awful might happen 0 0 0 2  Total GAD 7 Score 0 2 2 14   Anxiety Difficulty Not difficult at all Not difficult at all Not  difficult at all Extremely difficult       08/30/2022   11:16 AM 07/19/2022   10:24 AM 07/13/2022   10:46 AM  Depression screen PHQ 2/9  Decreased Interest 0 0 0  Down, Depressed, Hopeless 0 0 1  PHQ - 2 Score 0 0 1  Altered sleeping 0 0 0  Tired, decreased energy 0 0 0  Change in appetite 0 0 1  Feeling bad or failure about yourself  0 0 0  Trouble concentrating 0 1 0  Moving slowly or fidgety/restless 0 0 0  Suicidal thoughts 0 0 0  PHQ-9 Score 0 1 2  Difficult doing work/chores Not difficult at all Not difficult at all Not difficult at all    BP Readings from Last 3 Encounters:  08/30/22 136/72  08/28/22 128/82  08/16/22 105/63    Physical Exam Vitals and nursing note reviewed.  Constitutional:      General: She is in acute distress.     Appearance: She is well-developed.  HENT:     Head: Normocephalic and atraumatic.  Pulmonary:     Effort: Pulmonary effort is normal. No respiratory distress.  Genitourinary:    Labia:        Right: Rash and tenderness present.        Left: Rash and tenderness present.      Comments: Scattered pustules and vesicles on both labia and inner left thigh. Labial swollen and tender -  Skin split in the perineum and above the rectum Unable to perform vaginal exam due to swelling and pain Lymphadenopathy:     Lower Body: No right inguinal adenopathy. No left inguinal adenopathy.  Skin:    General: Skin is warm and dry.     Findings: No rash.  Neurological:     Mental Status: She is alert and oriented to person, place, and time.  Psychiatric:        Mood and Affect: Mood normal.        Behavior: Behavior normal.     Wt Readings from Last 3 Encounters:  08/30/22 209 lb (94.8 kg)  08/28/22 215 lb 15.8 oz (98 kg)  08/16/22 216 lb (98 kg)    BP 136/72   Pulse (!) 103   Ht 5\' 5"  (1.651 m)   Wt 209 lb (94.8 kg)   SpO2 96%   BMI 34.78 kg/m   Assessment and Plan: Problem List Items Addressed This Visit   None Visit  Diagnoses     Rash of genital area    -  Primary   Concern for HSV in the vesicular lesions will get lab testing Start  Valtrex presumptively   Relevant Medications   valACYclovir (VALTREX) 1000 MG tablet   Other Relevant Orders   HSV(herpes simplex vrs) 1+2 ab-IgG   Folliculitis       with pustules after hot tub exposure will change Keflex to Bactrim for staph coverage   Relevant Medications   sulfamethoxazole-trimethoprim (BACTRIM DS) 800-160 MG tablet   Fissure of skin       stop Lidocain/nifedipine on the skin - can continue rectally apply Mupirocin bid   Relevant Medications   mupirocin cream (BACTROBAN) 2 %       Patient has also reached out to her breast oncologist at [facility] and they have sent an urgent referral to their GYN - she is hoping for an appointment with them soon. I will give her one refill on the pain medications if needed without an additional appointment. Partially dictated using Editor, commissioning. Any errors are unintentional.  Halina Maidens, MD [facility]  08/30/2022

## 2022-08-31 ENCOUNTER — Encounter: Payer: Self-pay | Admitting: Internal Medicine

## 2022-08-31 LAB — HSV 1 AND 2 AB, IGG
HSV 1 Glycoprotein G Ab, IgG: <0.91 index (ref 0.00–0.90)
HSV 2 IgG, Type Spec: <0.91 index (ref 0.00–0.90)

## 2022-09-01 NOTE — Telephone Encounter (Signed)
Please review.  KP

## 2022-10-02 ENCOUNTER — Ambulatory Visit: Payer: Self-pay | Admitting: Surgery

## 2022-10-11 ENCOUNTER — Ambulatory Visit (INDEPENDENT_AMBULATORY_CARE_PROVIDER_SITE_OTHER): Payer: Self-pay | Admitting: Surgery

## 2022-10-11 ENCOUNTER — Telehealth: Payer: Self-pay | Admitting: Surgery

## 2022-10-11 ENCOUNTER — Encounter: Payer: Self-pay | Admitting: Surgery

## 2022-10-11 VITALS — BP 104/71 | HR 64 | Temp 98.3°F | Ht 65.0 in | Wt 213.0 lb

## 2022-10-11 DIAGNOSIS — K602 Anal fissure, unspecified: Secondary | ICD-10-CM

## 2022-10-11 MED ORDER — ONABOTULINUMTOXINA 100 UNITS IJ SOLR: 50.0000 [IU] | Freq: Once | INTRAMUSCULAR | Status: AC

## 2022-10-11 NOTE — Progress Notes (Signed)
Outpatient Surgical Follow Up  10/11/2022  Allison Mccarty is an 37 y.o. female.   Chief Complaint  Patient presents with   Follow-up    HPI: 37 year old female with persistent anal fissure.  She did have initial improvement with nifedipine cream sitz bath's.  Unfortunately the last month had a setback.  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  She has been on antiretroviral with good response.  She also had constipation that exacerbated anorectal pain.  She now has more pain in the rectal area worsened after bowel movement and some hematochezia.  No fevers no chills.  She is still using nifedipine cream.  She thinks that she is ready for the next step  Past Medical History:  Diagnosis Date   Anxiety 2015   Bipolar 1 disorder ([place])    Depression 2009   Insomnia    Irregular menstrual bleeding     No past surgical history on file.  Family History  Problem Relation Age of Onset   Breast cancer Mother    Uterine cancer Mother    Heart disease Father    Parkinsonism Father    Ovarian cancer Maternal Aunt    Breast cancer Maternal Aunt    Stroke Maternal Grandmother    Breast cancer Maternal Grandmother     Social History:  reports that she has never smoked. She has never been exposed to tobacco smoke. She does not have any smokeless tobacco history on file. She reports that she does not currently use alcohol. She reports that she does not currently use drugs.  Allergies: No Known Allergies  Medications reviewed.    ROS Full ROS performed and is otherwise negative other than what is stated in HPI   BP 104/71   Pulse 64   Temp 98.3 F (36.8 C)   Ht '5\' 5"'$  (1.651 m)   Wt 213 lb (96.6 kg)   LMP 10/02/2022 (Exact Date)   SpO2 97%   BMI 35.45 kg/m   Physical Exam CONSTITUTIONAL: NAD. EYES: Pupils are equal, round,, Sclera are non-icteric. EARS, NOSE, MOUTH AND THROAT: The oropharynx is clear. The oral mucosa is pink and  moist. Hearing is intact to voice. LYMPH NODES:  Lymph nodes in the neck are normal. RESPIRATORY:  Lungs are clear. There is normal respiratory effort, with equal breath sounds bilaterally, and without pathologic use of accessory muscles. CARDIOVASCULAR: Heart is regular without murmurs, gallops, or rubs. GI: The abdomen is  soft, nontender, and nondistended. There are no palpable masses. There is no hepatosplenomegaly. There are normal bowel sounds in all quadrants. Rectal: No evidence of an abscess ,there is no evidence of any rectal masses.  There is evidence of tenderness to palpation ON posterior midline with a fissure    MUSCULOSKELETAL: Normal muscle strength and tone. No cyanosis or edema.   SKIN: Turgor is good and there are no pathologic skin lesions or ulcers. NEUROLOGIC: Motor and sensation is grossly normal. Cranial nerves are grossly intact. PSYCH:  Oriented to person, place and time. Affect is normal.   Assessment/Plan: Persistent anal fissure only partially responsive to nifedipine cream and sitz bath's.  Discussed with the patient in detail about my thought process at 2 think that the neck step is buttocks injection.  I Discussed with her the risks, the benefits and the possible complications.of chemical sphincterotomy with Botox   She will call us back when she is ready.  Please note that I spent 30 minutes in this encounter including  personally reviewing imaging studies, coordinating her care and performing appropriate documentation  Caroleen Hamman, MD [facility]

## 2022-10-11 NOTE — H&P (View-Only) (Signed)
Outpatient Surgical Follow Up  10/11/2022  Allison Mccarty is an 36 y.o. female.   Chief Complaint  Patient presents with   Follow-up    HPI: 36-year-old female with persistent anal fissure.  She did have initial improvement with nifedipine cream sitz bath's.  Unfortunately the last month had a setback.  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  She has been on antiretroviral with good response.  She also had constipation that exacerbated anorectal pain.  She now has more pain in the rectal area worsened after bowel movement and some hematochezia.  No fevers no chills.  She is still using nifedipine cream.  She thinks that she is ready for the next step  Past Medical History:  Diagnosis Date   Anxiety 2015   Bipolar 1 disorder ([facility])    Depression 2009   Insomnia    Irregular menstrual bleeding     No past surgical history on file.  Family History  Problem Relation Age of Onset   Breast cancer Mother    Uterine cancer Mother    Heart disease Father    Parkinsonism Father    Ovarian cancer Maternal Aunt    Breast cancer Maternal Aunt    Stroke Maternal Grandmother    Breast cancer Maternal Grandmother     Social History:  reports that she has never smoked. She has never been exposed to tobacco smoke. She does not have any smokeless tobacco history on file. She reports that she does not currently use alcohol. She reports that she does not currently use drugs.  Allergies: No Known Allergies  Medications reviewed.    ROS Full ROS performed and is otherwise negative other than what is stated in HPI   BP 104/71   Pulse 64   Temp 98.3 F (36.8 C)   Ht 5' 5" (1.651 m)   Wt 213 lb (96.6 kg)   LMP 10/02/2022 (Exact Date)   SpO2 97%   BMI 35.45 kg/m   Physical Exam CONSTITUTIONAL: NAD. EYES: Pupils are equal, round,, Sclera are non-icteric. EARS, NOSE, MOUTH AND THROAT: The oropharynx is clear. The oral mucosa is pink and  moist. Hearing is intact to voice. LYMPH NODES:  Lymph nodes in the neck are normal. RESPIRATORY:  Lungs are clear. There is normal respiratory effort, with equal breath sounds bilaterally, and without pathologic use of accessory muscles. CARDIOVASCULAR: Heart is regular without murmurs, gallops, or rubs. GI: The abdomen is  soft, nontender, and nondistended. There are no palpable masses. There is no hepatosplenomegaly. There are normal bowel sounds in all quadrants. Rectal: No evidence of an abscess ,there is no evidence of any rectal masses.  There is evidence of tenderness to palpation ON posterior midline with a fissure    MUSCULOSKELETAL: Normal muscle strength and tone. No cyanosis or edema.   SKIN: Turgor is good and there are no pathologic skin lesions or ulcers. NEUROLOGIC: Motor and sensation is grossly normal. Cranial nerves are grossly intact. PSYCH:  Oriented to person, place and time. Affect is normal.   Assessment/Plan: Persistent anal fissure only partially responsive to nifedipine cream and sitz bath's.  Discussed with the patient in detail about my thought process at 2 think that the neck step is buttocks injection.  I Discussed with her the risks, the benefits and the possible complications.of chemical sphincterotomy with Botox   She will call us back when she is ready.  Please note that I spent 30 minutes in this encounter including   personally reviewing imaging studies, coordinating her care and performing appropriate documentation  Geronimo Diliberto, MD FACS General Surgeon 

## 2022-10-11 NOTE — Patient Instructions (Signed)
You have requested to have a Chemical Sphincterotomy with Botox injection. This will be scheduled with Dr. Dahlia Byes at [facility].   Please see your (blue)pre-care sheet for information. Our surgery scheduler will call you to verify surgery date and to go over information.   You will need to arrange to be off work for 3-4 days.   If you have FMLA or disability paperwork that needs filled out you may drop this off at our office or this can be faxed to [phone].

## 2022-10-11 NOTE — Telephone Encounter (Signed)
Patient has been advised of Pre-Admission date/time, and Surgery date at [facility].  Surgery Date: 10/19/22 Preadmission Testing Date: 10/12/22 (phone 8a-1p)  Patient has been made aware to call [phone], between 1-3:00pm the day before surgery, to find out what time to arrive for surgery.

## 2022-10-12 ENCOUNTER — Encounter
Admission: RE | Admit: 2022-10-12 | Discharge: 2022-10-12 | Disposition: A | Discharge status: Home / Self Care | Payer: Self-pay | Source: Ambulatory Visit | Attending: Surgery | Admitting: Surgery

## 2022-10-12 ENCOUNTER — Other Ambulatory Visit: Payer: Self-pay

## 2022-10-12 DIAGNOSIS — Z01812 Encounter for preprocedural laboratory examination: Secondary | ICD-10-CM

## 2022-10-12 HISTORY — DX: Personal history of Methicillin resistant Staphylococcus aureus infection: Z86.14

## 2022-10-12 NOTE — Patient Instructions (Addendum)
Your procedure is scheduled on: 10/19/22 - Thursday Report to the Registration Desk on the 1st floor of the [facility]. To find out your arrival time, please call [phone] between 1PM - 3PM on: 10/18/22 - Wednesday If your arrival time is 6:00 am, do not arrive before that time as the [facility] entrance doors do not open until 6:00 am.  REMEMBER: Instructions that are not followed completely may result in serious medical risk, up to and including death; or upon the discretion of your surgeon and anesthesiologist your surgery may need to be rescheduled.  Do not eat food after midnight the night before surgery.  No gum chewing or hard candies.  You may however, drink CLEAR liquids up to 2 hours before you are scheduled to arrive for your surgery. Do not drink anything within 2 hours of your scheduled arrival time.  Clear liquids include: - water  - apple juice without pulp - gatorade (not RED colors) - black coffee or tea (Do NOT add milk or creamers to the coffee or tea) Do NOT drink anything that is not on this list.   One week prior to surgery: Stop Anti-inflammatories (NSAIDS) such as Advil, Aleve, Ibuprofen, Motrin, Naproxen, Naprosyn and Aspirin based products such as Excedrin, Goody's Powder, BC Powder.  Stop ANY OVER THE COUNTER supplements until after surgery.  You may take Tylenol if needed for pain up until the day of surgery.   TAKE ONLY THESE MEDICATIONS THE MORNING OF SURGERY WITH A SIP OF WATER:  NONE   No Alcohol for 24 hours before or after surgery.  No Smoking including e-cigarettes for 24 hours before surgery.  No chewable tobacco products for at least 6 hours before surgery.  No nicotine patches on the day of surgery.  Do not use any "recreational" drugs for at least a week (preferably 2 weeks) before your surgery.  Please be advised that the combination of cocaine and anesthesia may have negative outcomes, up to and including death. If you test  positive for cocaine, your surgery will be cancelled.  On the morning of surgery brush your teeth with toothpaste and water, you may rinse your mouth with mouthwash if you wish. Do not swallow any toothpaste or mouthwash.  Do not wear jewelry, make-up, hairpins, clips or nail polish.  Do not wear lotions, powders, or perfumes.   Do not shave body hair from the neck down 48 hours before surgery.  Contact lenses, hearing aids and dentures may not be worn into surgery.  Do not bring valuables to the hospital. [facility] is not responsible for any missing/lost belongings or valuables.   Notify your doctor if there is any change in your medical condition (cold, fever, infection).  Wear comfortable clothing (specific to your surgery type) to the hospital.  After surgery, you can help prevent lung complications by doing breathing exercises.  Take deep breaths and cough every 1-2 hours. Your doctor may order a device called an Incentive Spirometer to help you take deep breaths. When coughing or sneezing, hold a pillow firmly against your incision with both hands. This is called "splinting." Doing this helps protect your incision. It also decreases belly discomfort.  If you are being admitted to the hospital overnight, leave your suitcase in the car. After surgery it may be brought to your room.  In case of increased patient census, it may be necessary for you, the patient, to continue your postoperative care in the Same Day Surgery department.  If  you are being discharged the day of surgery, you will not be allowed to drive home. You will need a responsible individual to drive you home and stay with you for 24 hours after surgery.   If you are taking public transportation, you will need to have a responsible individual with you.  Please call the [facility] Dept. at [phone] if you have any questions about these instructions.  Surgery Visitation Policy:  Patients  undergoing a surgery or procedure may have two family members or support persons with them as long as the person is not COVID-19 positive or experiencing its symptoms.   Inpatient Visitation:    Visiting hours are 7 a.m. to 8 p.m. Up to four visitors are allowed at one time in a patient room. The visitors may rotate out with other people during the day. One designated support person (adult) may remain overnight.  Due to an increase in RSV and influenza rates and associated hospitalizations, children ages 35 and under will not be able to visit patients in [facility]. Masks continue to be strongly recommended.

## 2022-10-19 ENCOUNTER — Other Ambulatory Visit: Payer: Self-pay

## 2022-10-19 ENCOUNTER — Ambulatory Visit
Admission: RE | Admit: 2022-10-19 | Discharge: 2022-10-19 | Disposition: A | Discharge status: Home / Self Care | Payer: Self-pay | Source: Ambulatory Visit | Attending: Surgery | Admitting: Surgery

## 2022-10-19 ENCOUNTER — Ambulatory Visit: Payer: Self-pay | Admitting: Anesthesiology

## 2022-10-19 ENCOUNTER — Encounter: Payer: Self-pay | Admitting: Surgery

## 2022-10-19 ENCOUNTER — Encounter
Admission: RE | Disposition: A | Discharge status: Home / Self Care | Payer: Self-pay | Source: Ambulatory Visit | Attending: Surgery

## 2022-10-19 DIAGNOSIS — K602 Anal fissure, unspecified: Secondary | ICD-10-CM | POA: Insufficient documentation

## 2022-10-19 DIAGNOSIS — F319 Bipolar disorder, unspecified: Secondary | ICD-10-CM | POA: Insufficient documentation

## 2022-10-19 DIAGNOSIS — Z8614 Personal history of Methicillin resistant Staphylococcus aureus infection: Secondary | ICD-10-CM | POA: Insufficient documentation

## 2022-10-19 DIAGNOSIS — Z01812 Encounter for preprocedural laboratory examination: Secondary | ICD-10-CM

## 2022-10-19 DIAGNOSIS — F419 Anxiety disorder, unspecified: Secondary | ICD-10-CM | POA: Insufficient documentation

## 2022-10-19 HISTORY — PX: BOTOX INJECTION: SHX5754

## 2022-10-19 LAB — POCT PREGNANCY, URINE: Preg Test, Ur: NEGATIVE

## 2022-10-19 SURGERY — EXAM UNDER ANESTHESIA
Anesthesia: General | Site: Anus

## 2022-10-19 MED ORDER — FENTANYL CITRATE (PF) 100 MCG/2ML IJ SOLN
INTRAMUSCULAR | Status: AC
  Filled 2022-10-19: qty 2

## 2022-10-19 MED ORDER — CHLORHEXIDINE GLUCONATE CLOTH 2 % EX PADS
6.0000 | MEDICATED_PAD | Freq: Once | CUTANEOUS | Status: AC
  Administered 2022-10-19: 6 via TOPICAL

## 2022-10-19 MED ORDER — LACTATED RINGERS IV SOLN: INTRAVENOUS | Status: DC

## 2022-10-19 MED ORDER — GABAPENTIN 300 MG PO CAPS
300.0000 mg | ORAL_CAPSULE | ORAL | Status: AC
  Administered 2022-10-19: 300 mg via ORAL

## 2022-10-19 MED ORDER — SODIUM CHLORIDE FLUSH 0.9 % IV SOLN
INTRAVENOUS | Status: AC
  Filled 2022-10-19: qty 10

## 2022-10-19 MED ORDER — FAMOTIDINE 20 MG PO TABS
ORAL_TABLET | ORAL | Status: AC
  Filled 2022-10-19: qty 1

## 2022-10-19 MED ORDER — PROPOFOL 10 MG/ML IV BOLUS
INTRAVENOUS | Status: AC
  Filled 2022-10-19: qty 20

## 2022-10-19 MED ORDER — CEFAZOLIN SODIUM-DEXTROSE 2-3 GM-%(50ML) IV SOLR
INTRAVENOUS | Status: DC | PRN
  Administered 2022-10-19: 2 g via INTRAVENOUS

## 2022-10-19 MED ORDER — CEFAZOLIN SODIUM-DEXTROSE 2-4 GM/100ML-% IV SOLN
INTRAVENOUS | Status: AC
  Filled 2022-10-19: qty 100

## 2022-10-19 MED ORDER — MIDAZOLAM HCL 2 MG/2ML IJ SOLN
INTRAMUSCULAR | Status: DC | PRN
  Administered 2022-10-19: 2 mg via INTRAVENOUS

## 2022-10-19 MED ORDER — DEXMEDETOMIDINE HCL IN NACL 200 MCG/50ML IV SOLN
INTRAVENOUS | Status: DC | PRN
  Administered 2022-10-19: 8 ug via INTRAVENOUS

## 2022-10-19 MED ORDER — ACETAMINOPHEN 500 MG PO TABS
ORAL_TABLET | ORAL | Status: AC
  Filled 2022-10-19: qty 2

## 2022-10-19 MED ORDER — BUPIVACAINE LIPOSOME 1.3 % IJ SUSP
INTRAMUSCULAR | Status: AC
  Filled 2022-10-19: qty 20

## 2022-10-19 MED ORDER — GABAPENTIN 300 MG PO CAPS
ORAL_CAPSULE | ORAL | Status: AC
  Filled 2022-10-19: qty 1

## 2022-10-19 MED ORDER — BUPIVACAINE LIPOSOME 1.3 % IJ SUSP
INTRAMUSCULAR | Status: DC | PRN
  Administered 2022-10-19: 20 mL

## 2022-10-19 MED ORDER — PROPOFOL 500 MG/50ML IV EMUL
INTRAVENOUS | Status: DC | PRN
  Administered 2022-10-19: 120 ug/kg/min via INTRAVENOUS
  Administered 2022-10-19: 100 ug/kg/min via INTRAVENOUS

## 2022-10-19 MED ORDER — ACETAMINOPHEN 500 MG PO TABS
1000.0000 mg | ORAL_TABLET | ORAL | Status: AC
  Administered 2022-10-19: 1000 mg via ORAL

## 2022-10-19 MED ORDER — MIDAZOLAM HCL 2 MG/2ML IJ SOLN
INTRAMUSCULAR | Status: AC
  Filled 2022-10-19: qty 2

## 2022-10-19 MED ORDER — CELECOXIB 200 MG PO CAPS
ORAL_CAPSULE | ORAL | Status: AC
  Filled 2022-10-19: qty 1

## 2022-10-19 MED ORDER — FAMOTIDINE 20 MG PO TABS
20.0000 mg | ORAL_TABLET | Freq: Once | ORAL | Status: AC
  Administered 2022-10-19: 20 mg via ORAL

## 2022-10-19 MED ORDER — ORAL CARE MOUTH RINSE: 15.0000 mL | Freq: Once | OROMUCOSAL | Status: AC

## 2022-10-19 MED ORDER — ONABOTULINUMTOXINA 100 UNITS IJ SOLR
INTRAMUSCULAR | Status: DC | PRN
  Administered 2022-10-19: 50 [IU] via INTRAMUSCULAR

## 2022-10-19 MED ORDER — FENTANYL CITRATE (PF) 100 MCG/2ML IJ SOLN: 25.0000 ug | INTRAMUSCULAR | Status: DC | PRN

## 2022-10-19 MED ORDER — FENTANYL CITRATE (PF) 100 MCG/2ML IJ SOLN
INTRAMUSCULAR | Status: DC | PRN
  Administered 2022-10-19: 50 ug via INTRAVENOUS
  Administered 2022-10-19 (×2): 25 ug via INTRAVENOUS

## 2022-10-19 MED ORDER — CHLORHEXIDINE GLUCONATE 0.12 % MT SOLN
OROMUCOSAL | Status: AC
  Filled 2022-10-19: qty 15

## 2022-10-19 MED ORDER — CELECOXIB 200 MG PO CAPS
200.0000 mg | ORAL_CAPSULE | ORAL | Status: AC
  Administered 2022-10-19: 200 mg via ORAL

## 2022-10-19 MED ORDER — CHLORHEXIDINE GLUCONATE 0.12 % MT SOLN
15.0000 mL | Freq: Once | OROMUCOSAL | Status: AC
  Administered 2022-10-19: 15 mL via OROMUCOSAL

## 2022-10-19 MED ORDER — HYDROCODONE-ACETAMINOPHEN 5-325 MG PO TABS: 1.0000 | ORAL_TABLET | Freq: Four times a day (QID) | ORAL | 0 refills | Status: DC | PRN

## 2022-10-19 SURGICAL SUPPLY — 31 items
BLADE SURG 15 STRL LF DISP TIS (BLADE) ×2 IMPLANT
BLADE SURG 15 STRL SS (BLADE) ×2
BRIEF MESH DISP 2XL (UNDERPADS AND DIAPERS) ×2 IMPLANT
DRAPE 3/4 80X56 (DRAPES) ×2 IMPLANT
DRAPE LEGGINS SURG 28X43 STRL (DRAPES) ×2 IMPLANT
DRAPE UNDER BUTTOCK W/FLU (DRAPES) ×2 IMPLANT
ELECT CAUTERY BLADE 6.4 (BLADE) ×2 IMPLANT
ELECT REM PT RETURN 9FT ADLT (ELECTROSURGICAL) ×2
ELECTRODE REM PT RTRN 9FT ADLT (ELECTROSURGICAL) ×2 IMPLANT
GAUZE 4X4 16PLY ~~LOC~~+RFID DBL (SPONGE) ×2 IMPLANT
GLOVE BIO SURGEON STRL SZ7 (GLOVE) ×2 IMPLANT
GOWN STRL REUS W/ TWL LRG LVL3 (GOWN DISPOSABLE) ×4 IMPLANT
GOWN STRL REUS W/TWL LRG LVL3 (GOWN DISPOSABLE) ×4
KIT TURNOVER CYSTO (KITS) ×2 IMPLANT
MANIFOLD NEPTUNE II (INSTRUMENTS) ×2 IMPLANT
NDL HPO THNWL 1X22GA REG BVL (NEEDLE) ×4 IMPLANT
NDL HYPO 22X1.5 SAFETY MO (MISCELLANEOUS) ×2 IMPLANT
NEEDLE HYPO 22X1.5 SAFETY MO (MISCELLANEOUS) ×2 IMPLANT
NEEDLE SAFETY 22GX1 (NEEDLE) ×4
NS IRRIG 1000ML POUR BTL (IV SOLUTION) ×2 IMPLANT
PACK BASIN MINOR ARMC (MISCELLANEOUS) ×2 IMPLANT
PAD OB MATERNITY 4.3X12.25 (PERSONAL CARE ITEMS) ×2 IMPLANT
PAD PREP 24X41 OB/GYN DISP (PERSONAL CARE ITEMS) ×2 IMPLANT
SOL PREP PVP 2OZ (MISCELLANEOUS) ×2
SOLUTION PREP PVP 2OZ (MISCELLANEOUS) ×2 IMPLANT
SPONGE T-LAP 18X18 ~~LOC~~+RFID (SPONGE) ×2 IMPLANT
SURGILUBE 2OZ TUBE FLIPTOP (MISCELLANEOUS) ×2 IMPLANT
SYR 10ML LL (SYRINGE) ×2 IMPLANT
SYR 20ML LL LF (SYRINGE) ×2 IMPLANT
TRAP FLUID SMOKE EVACUATOR (MISCELLANEOUS) ×2 IMPLANT
WATER STERILE IRR 500ML POUR (IV SOLUTION) ×2 IMPLANT

## 2022-10-19 NOTE — Op Note (Signed)
  PRE-OPERATIVE DIAGNOSIS:  Anal fissure  POST-OPERATIVE DIAGNOSIS:  Anal fissure  PROCEDURE:   1. Anorectal Exam under Anesthesia 2. Chemical Lateral internal Sphincterotomy using 50 IU Botox  SURGEON:  Surgeon(s) and Role:    * Adaly Puder F, MD - Primary  FINDINGS: posterior midline  \EBL: minimal  ANESTHESIA: General MAC    DICTATION:  Patient was explained about the  Procedure in detail. Risks, benefits and possible complications ( including but not limited to recurrence, transient incontinence, pain, bleeding)  and a consent was obtained. The patient taken to the operating room and placed in the lithotomy position.   Exam under anesthesia using the anal speculum revealed a posterior midline fissures.  .  No other intraluminal lesions were observed. Digital palpation was performed identifying the Internal Sphincter muscle and on the lateral aspect and injected 50 international units of Botox in the standard fashion. Liposomal Marcaine  was injected around the perianal site. Needle and laparotomy counts were correct and there were no immediate complications  Jules Husbands, MD, FACS

## 2022-10-19 NOTE — Discharge Instructions (Addendum)
Anal Fistulotomy, Care After This sheet gives you information about how to care for yourself after your procedure. Your health care provider may also give you more specific instructions. If you have problems or questions, contact your health care provider. What can I expect after the procedure? After the procedure, it is common to have: Some pain, discomfort, and swelling. Increased pain during bowel movements. Some bleeding from the incision area. Some leakage of stool. Follow these instructions at home: Medicines Take over-the-counter and prescription medicines only as told by your health care provider. If you were prescribed an antibiotic medicine, take it as told by your health care provider. Do not stop taking the antibiotic even if you start to feel better. Ask your health care provider if the medicine prescribed to you requires you to avoid driving or using heavy machinery. Incision care  Follow instructions from your health care provider about how to take care of your incision. Make sure you: Wash your hands with soap and water before and after you remove your gauze (dressing). If soap and water are not available, use hand sanitizer. Remove your dressing as told by your health care provider. In some cases, you may be told not to remove the dressing, but to allow it to come out with your first bowel movement after surgery. Leave stitches (sutures), skin glue, or adhesive strips in place. These skin closures may need to stay in place for 2 weeks or longer. Do not remove adhesive strips completely unless your health care provider tells you to do that. Keep the incision area clean and dry. Check your incision area every day for signs of infection. Check for: More redness, swelling, or pain. More fluid or blood. Warmth. Pus or a bad smell. Self-care After a bowel movement, clean the incision area using one of the following methods: Gently wipe with a moist towelette. Gently wipe with  mild soap and water. Take a shower. Take a sitz bath. This is a shallow, warm-water bath that attaches to the toilet bowl. You can also sit in a bathtub filled with warm water. Do not swim or use a hot tub until your health care provider approves. To reduce discomfort, you may apply ice to the incision area. To do this: Put ice in a plastic bag. Place a towel between your skin and the bag. Leave the ice on for 20 minutes, 2-3 times a day. Activity  Rest as told by your health care provider. Avoid sitting for a long time without moving. Get up to take short walks every 1-2 hours. This is important to improve blood flow and breathing. Ask for help if you feel weak or unsteady. Do not drive for 24 hours if you were given a sedative during your procedure. Do not lift anything that is heavier than 10 lb (4.5 kg), or the limit that you are told, until your health care provider says that it is safe. Return to your normal activities as told by your health care provider. Ask your health care provider what activities are safe for you. Eating and drinking Follow instructions from your health care provider about eating or drinking restrictions. You may need to take these actions to prevent or treat constipation: Drink enough fluid to keep your urine pale yellow. Eat foods that are high in fiber, such as beans, whole grains, and fresh fruits and vegetables. Limit foods that are high in fat and processed sugars, such as fried or sweet foods. Lifestyle Do not use any products that  contain nicotine or tobacco, such as cigarettes, e-cigarettes, and chewing tobacco. If you need help quitting, ask your health care provider. Do not drink alcohol if: Your health care provider tells you not to drink. You are pregnant, may be pregnant, or are planning to become pregnant. If you drink alcohol: Limit how much you use to: 0-1 drink a day for women. 0-2 drinks a day for men. Be aware of how much alcohol is in  your drink. In the [place] one drink equals one 12 oz bottle of beer (355 mL), one 5 oz glass of wine (148 mL), or one 1 oz glass of hard liquor (44 mL). General instructions If you have bleeding from the incision area, wear a pad to absorb blood. Change it often. Keep all follow-up visits as told by your health care provider. This is important. Contact a health care provider if: You have any of these signs of infection: More redness, swelling, or pain around your incision area. Warmth coming from your incision area, or your incision area is firm. Pus or a bad smell coming from your incision area. A fever or chills. You develop swelling or tenderness in your groin area. You cannot control your bowel movements (incontinence), or you are leaking stool. You have trouble urinating. You have pain that does not get better with medicine. Get help right away if: You have severe pain in your abdomen or incision area. You have sudden chest pain. You become weak or you faint. You have more fluid or blood coming from your incision. You have bleeding from your incision that soaks 2 or more pads during 24 hours. Summary After anal fistulotomy, it is common to have increased pain during bowel movements and some bleeding. If a dressing was placed in your incision during surgery, remove it as told by your health care provider. It is important to keep the incision area clean and dry after each bowel movement. If you have bleeding from the incision area, wear a pad to absorb blood. Change it often. This information is not intended to replace advice given to you by your health care provider. Make sure you discuss any questions you have with your health care provider. Document Revised: 12/30/2018 Document Reviewed: 12/30/2018 Elsevier Patient Education  [place]   The drugs that you were given will stay in your system until tomorrow so for the next 24  hours you should not:  Drive an automobile Make any legal decisions Drink any alcoholic beverage   You may resume regular meals tomorrow.  Today it is better to start with liquids and gradually work up to solid foods.  You may eat anything you prefer, but it is better to start with liquids, then soup and crackers, and gradually work up to solid foods.   Please notify your doctor immediately if you have any unusual bleeding, trouble breathing, redness and pain at the surgery site, drainage, fever, or pain not relieved by medication.    Additional Instructions:        Please contact your physician with any problems or Same Day Surgery at [phone], Monday through Friday 6 am to 4 pm, or [place] at [facility] number at [phone].

## 2022-10-19 NOTE — Interval H&P Note (Signed)
History and Physical Interval Note:  10/19/2022 11:52 AM  Allison Mccarty  has presented today for surgery, with the diagnosis of anal fissure.  The various methods of treatment have been discussed with the patient and family. After consideration of risks, benefits and other options for treatment, the patient has consented to  Procedure(s): EXAM UNDER ANESTHESIA (N/A) SPHINCTEROTOMY (N/A) BOTOX INJECTION (N/A) as a surgical intervention.  The patient's history has been reviewed, patient examined, no change in status, stable for surgery.  I have reviewed the patient's chart and labs.  Questions were answered to the patient's satisfaction.     [place]

## 2022-10-19 NOTE — Transfer of Care (Signed)
Immediate Anesthesia Transfer of Care Note  Patient: Allison Mccarty  Procedure(s) Performed: Jasmine December UNDER ANESTHESIA BOTOX INJECTION (Anus)  Patient Location: PACU  Anesthesia Type:General  Level of Consciousness: awake, alert , and oriented  Airway & Oxygen Therapy: Patient Spontanous Breathing  Post-op Assessment: Report given to RN and Post -op Vital signs reviewed and stable  Post vital signs: Reviewed and stable  Last Vitals:  Vitals Value Taken Time  BP 98/55 10/19/22 1238  Temp 36.1 C 10/19/22 1238  Pulse 71 10/19/22 1243  Resp 13 10/19/22 1243  SpO2 97 % 10/19/22 1243  Vitals shown include unvalidated device data.  Last Pain:  Vitals:   10/19/22 1238  TempSrc:   PainSc: 0-No pain      Patients Stated Pain Goal: 0 ([id number])  Complications: No notable events documented.

## 2022-10-19 NOTE — Anesthesia Preprocedure Evaluation (Signed)
Anesthesia Evaluation  Patient identified by MRN, date of birth, ID band Patient awake    Reviewed: Allergy & Precautions, H&P , NPO status , Patient's Chart, lab work & pertinent test results, reviewed documented beta blocker date and time   History of Anesthesia Complications Negative for: history of anesthetic complications  Airway Mallampati: I  TM Distance: >3 FB Neck ROM: full    Dental  (+) Dental Advidsory Given, Teeth Intact   Pulmonary neg pulmonary ROS   Pulmonary exam normal breath sounds clear to auscultation       Cardiovascular Exercise Tolerance: Good negative cardio ROS Normal cardiovascular exam Rhythm:regular Rate:Normal     Neuro/Psych negative neurological ROS  negative psych ROS   GI/Hepatic negative GI ROS, Neg liver ROS,,,  Endo/Other  negative endocrine ROS    Renal/GU negative Renal ROS  negative genitourinary   Musculoskeletal   Abdominal   Peds  Hematology negative hematology ROS (+)   Anesthesia Other Findings Past Medical History: 2015: Anxiety No date: Bipolar 1 disorder ([place]) 2009: Depression No date: Hx MRSA infection No date: Insomnia No date: Irregular menstrual bleeding   Reproductive/Obstetrics negative OB ROS                             Anesthesia Physical Anesthesia Plan  ASA: 1  Anesthesia Plan: General   Post-op Pain Management:    Induction: Intravenous  PONV Risk Score and Plan: Propofol infusion and TIVA  Airway Management Planned: Natural Airway and Simple Face Mask  Additional Equipment:   Intra-op Plan:   Post-operative Plan:   Informed Consent: I have reviewed the patients History and Physical, chart, labs and discussed the procedure including the risks, benefits and alternatives for the proposed anesthesia with the patient or authorized representative who has indicated his/her understanding and acceptance.      Dental Advisory Given  Plan Discussed with: Anesthesiologist, CRNA and Surgeon  Anesthesia Plan Comments:        Anesthesia Quick Evaluation

## 2022-10-19 NOTE — Anesthesia Postprocedure Evaluation (Signed)
Anesthesia Post Note  Patient: Allison Mccarty  Procedure(s) Performed: EXAM UNDER ANESTHESIA BOTOX INJECTION (Anus)  Patient location during evaluation: PACU Anesthesia Type: General Level of consciousness: awake and alert Pain management: pain level controlled Vital Signs Assessment: post-procedure vital signs reviewed and stable Respiratory status: spontaneous breathing, nonlabored ventilation, respiratory function stable and patient connected to nasal cannula oxygen Cardiovascular status: blood pressure returned to baseline and stable Postop Assessment: no apparent nausea or vomiting Anesthetic complications: no   No notable events documented.   Last Vitals:  Vitals:   10/19/22 1306 10/19/22 1320  BP: 114/78 (!) 104/58  Pulse: 66 (!) 50  Resp: 14 16  Temp: (!) 36.1 C 37.4 C  SpO2: 100% 100%    Last Pain:  Vitals:   10/19/22 1320  TempSrc: Temporal  PainSc: 0-No pain                 Martha Clan

## 2022-10-20 ENCOUNTER — Encounter: Payer: Self-pay | Admitting: Surgery

## 2022-11-01 ENCOUNTER — Other Ambulatory Visit: Payer: Self-pay

## 2022-11-01 ENCOUNTER — Ambulatory Visit (INDEPENDENT_AMBULATORY_CARE_PROVIDER_SITE_OTHER): Payer: Self-pay | Admitting: Surgery

## 2022-11-01 ENCOUNTER — Encounter: Payer: Self-pay | Admitting: Surgery

## 2022-11-01 VITALS — BP 117/70 | HR 64 | Temp 98.4°F | Ht 65.0 in | Wt 214.0 lb

## 2022-11-01 DIAGNOSIS — K602 Anal fissure, unspecified: Secondary | ICD-10-CM

## 2022-11-01 NOTE — Patient Instructions (Signed)
Continue with sitz baths. Avoid hard stools. Take Miralax or fiber to avoid constipation. Continue to use the Nifedipine cream.

## 2022-11-03 NOTE — Progress Notes (Signed)
Outpatient Surgical Follow Up  11/03/2022  [name] is an 37 y.o. female.   Chief Complaint  Patient presents with   Routine Post Op    EUA botox    HPI: 37 year old female following after recent chemical sphincterotomy with Botox 4 weeks ago.  She reports that initially did very well and then had some evidence of diarrhea from gastroenteritis that made things a little bit worse.  She is recovering from gastroenteritis and feels better.  No fevers no chills.  Still some mild anorectal pain when having a bowel movement.  Past Medical History:  Diagnosis Date   Anxiety 2015   Bipolar 1 disorder    Depression 2009   Hx MRSA infection    Insomnia    Irregular menstrual bleeding     Past Surgical History:  Procedure Laterality Date   BOTOX INJECTION N/A 10/19/2022   Procedure: BOTOX INJECTION;  Surgeon: Leafy RoPabon, Mathilda Maguire F, MD;  Location: [facility] ORS;  Service: General;  Laterality: N/A;   ROTATOR CUFF REPAIR Left 2006   ULNAR NERVE REPAIR Left    WISDOM TOOTH EXTRACTION      Family History  Problem Relation Age of Onset   Breast cancer Mother    Uterine cancer Mother    Heart disease Father    Parkinsonism Father    Ovarian cancer Maternal Aunt    Breast cancer Maternal Aunt    Stroke Maternal Grandmother    Breast cancer Maternal Grandmother     Social History:  reports that she has never smoked. She has never been exposed to tobacco smoke. She does not have any smokeless tobacco history on file. She reports that she does not currently use alcohol. She reports that she does not currently use drugs.  Allergies: No Known Allergies  Medications reviewed.    ROS Full ROS performed and is otherwise negative other than what is stated in HPI   BP 117/70   Pulse 64   Temp 98.4 F (36.9 C) (Oral)   Ht 5\' 5"  (1.651 m)   Wt 214 lb (97.1 kg)   LMP 10/02/2022 (Exact Date)   SpO2 100%   BMI 35.61 kg/m   Physical Exam Vitals and nursing note reviewed. Exam conducted  with a chaperone present.  Constitutional:      General: She is not in acute distress.    Appearance: Normal appearance.  Abdominal:     General: Abdomen is flat. There is no distension.     Palpations: Abdomen is soft. There is no mass.     Tenderness: There is no abdominal tenderness. There is no guarding or rebound.     Hernia: No hernia is present.  Genitourinary:    Comments: There is no evidence of anorectal sepsis or expanding hematomas or complications.  There might be 2 mm blood blister posterior midline not sure if this is related to the fissure or to the injection. Skin:    General: Skin is warm and dry.     Capillary Refill: Capillary refill takes less than 2 seconds.  Neurological:     General: No focal deficit present.     Mental Status: She is alert and oriented to person, place, and time.  Psychiatric:        Mood and Affect: Mood normal.        Behavior: Behavior normal.        Thought Content: Thought content normal.        Judgment: Judgment normal.  Assessment/Plan: 37 year old female with anal fissure status post chemical sphincterotomy.  She is doing better.  Encouraged her to continue nifedipine cream as well as sitz bath's.  I will be happy to see her in a couple months or so.  It is of complications    Please note that I spent 20 minutes in this encounter including personally reviewing recordss, coordinating her care, placing orders and performing appropriate documentation   Sterling Big, MD [facility] General Surgeon

## 2023-01-01 ENCOUNTER — Ambulatory Visit (INDEPENDENT_AMBULATORY_CARE_PROVIDER_SITE_OTHER): Payer: Self-pay | Admitting: Surgery

## 2023-01-01 ENCOUNTER — Encounter: Payer: Self-pay | Admitting: Surgery

## 2023-01-01 VITALS — BP 117/75 | HR 73 | Temp 98.4°F | Ht 65.0 in | Wt 224.8 lb

## 2023-01-01 DIAGNOSIS — K602 Anal fissure, unspecified: Secondary | ICD-10-CM

## 2023-01-01 DIAGNOSIS — Z09 Encounter for follow-up examination after completed treatment for conditions other than malignant neoplasm: Secondary | ICD-10-CM

## 2023-01-01 NOTE — Patient Instructions (Signed)
   Follow-up with our office as needed.  Please call and ask to speak with a nurse if you develop questions or concerns.  

## 2023-01-03 ENCOUNTER — Encounter: Payer: Self-pay | Admitting: Surgery

## 2023-01-03 NOTE — Progress Notes (Signed)
Outpatient Surgical Follow Up  01/03/2023  Allison Mccarty is an 37 y.o. female.   Chief Complaint  Patient presents with   Routine Post Op    EUA 10/19/22    HPI: 37 year old female with hx of anal fissure s/p botox. She feels much better, no hematochezia , no rectal pain . No fevers no chills. She is still using nifedipine cream.     Past Medical History:  Diagnosis Date   Anxiety 2015   Bipolar 1 disorder ([facility])    Depression 2009   Hx MRSA infection    Insomnia    Irregular menstrual bleeding     Past Surgical History:  Procedure Laterality Date   BOTOX INJECTION N/A 10/19/2022   Procedure: BOTOX INJECTION;  Surgeon: Leafy Ro, MD;  Location: [facility] ORS;  Service: General;  Laterality: N/A;   ROTATOR CUFF REPAIR Left 2006   ULNAR NERVE REPAIR Left    WISDOM TOOTH EXTRACTION      Family History  Problem Relation Age of Onset   Breast cancer Mother    Uterine cancer Mother    Heart disease Father    Parkinsonism Father    Ovarian cancer Maternal Aunt    Breast cancer Maternal Aunt    Stroke Maternal Grandmother    Breast cancer Maternal Grandmother     Social History:  reports that she has never smoked. She has never been exposed to tobacco smoke. She does not have any smokeless tobacco history on file. She reports that she does not currently use alcohol. She reports that she does not currently use drugs.  Allergies: No Known Allergies  Medications reviewed.    ROS Full ROS performed and is otherwise negative other than what is stated in HPI   BP 117/75   Pulse 73   Temp 98.4 F (36.9 C) (Oral)   Ht 5\' 5"  (1.651 m)   Wt 224 lb 12.8 oz (102 kg)   SpO2 98%   BMI 37.41 kg/m   Physical Exam Vitals and nursing note reviewed. Exam conducted with a chaperone present.  Constitutional:      General: She is not in acute distress.    Appearance: Normal appearance.  Abdominal:     General: Abdomen is flat. There is no distension.     Palpations:  Abdomen is soft. There is no mass.     Tenderness: There is no abdominal tenderness. There is no guarding or rebound.     Hernia: No hernia is present.  Genitourinary:    Comments: There is no evidence of anorectal sepsis or expanding hematomas or complications.  No evdience of visible fissures at this time. No infection or masses Skin:    General: Skin is warm and dry.     Capillary Refill: Capillary refill takes less than 2 seconds.  Neurological:     General: No focal deficit present.     Mental Status: She is alert and oriented to person, place, and time.  Psychiatric:        Mood and Affect: Mood normal.        Behavior: Behavior normal.        Thought Content: Thought content normal.        Judgment: Judgment normal.   Assessment/Plan:37 year old female with anal fissure status post chemical sphincterotomy.  She is doing well w resolution of sxs. Encouraged her to continue nifedipine cream as well as sitz bath's.  RTC prn   Please note that I spent 20 minutes in this  encounter including personally reviewing recordss, coordinating her care, placing orders and performing appropriate documentation  Sterling Big, MD [facility] General Surgeon

## 2024-04-12 IMAGING — US US PELVIS COMPLETE WITH TRANSVAGINAL
1 series · 13 of 25 positions shown · non-contrast
Comparison: None

CLINICAL DATA: Irregular cycles for a year.

EXAM:
TRANSABDOMINAL AND TRANSVAGINAL ULTRASOUND OF PELVIS
TECHNIQUE: Both transabdominal and transvaginal ultrasound examinations of the
pelvis were performed. Transabdominal technique was performed for
global imaging of the pelvis including uterus, ovaries, adnexal
regions, and pelvic cul-de-sac. It was necessary to proceed with
endovaginal exam following the transabdominal exam to visualize the
endometrium and ovaries.

[Series 1: us pelvis complete with transvaginal · 0.24mm/px · 13 of 74 slices shown]
[im 1/74]
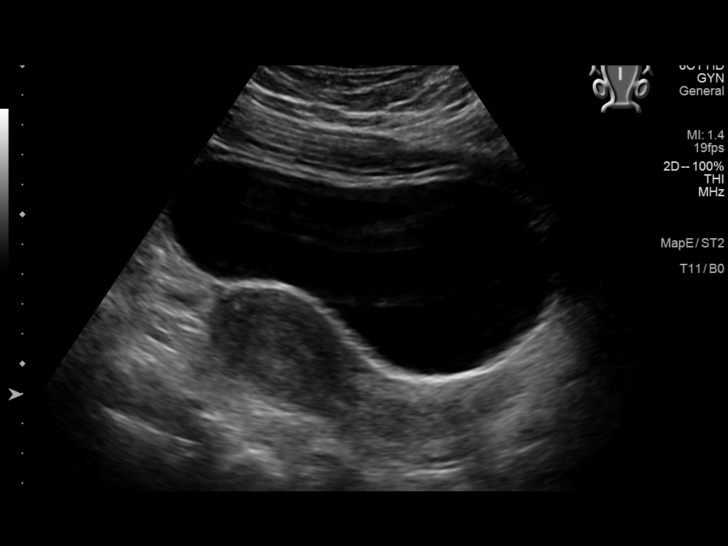
[im 7/74]
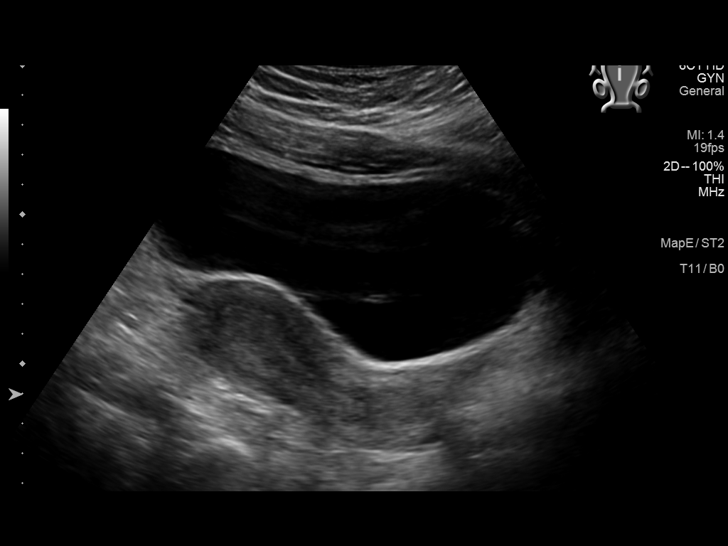
[im 13/74]
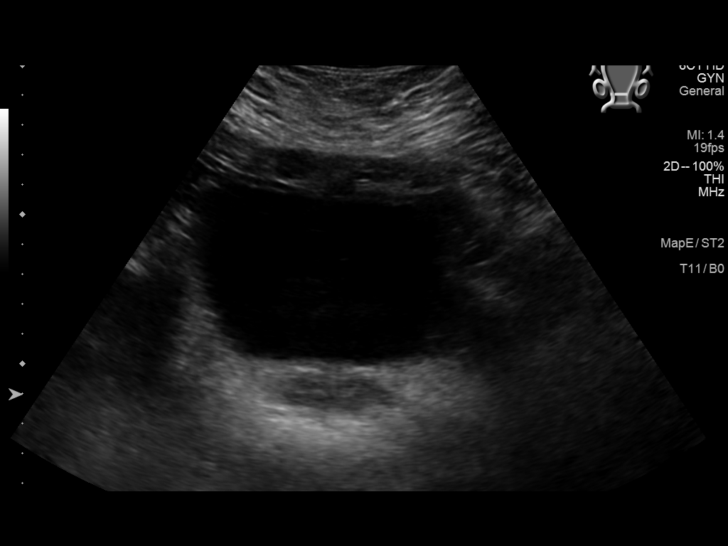
[im 19/74]
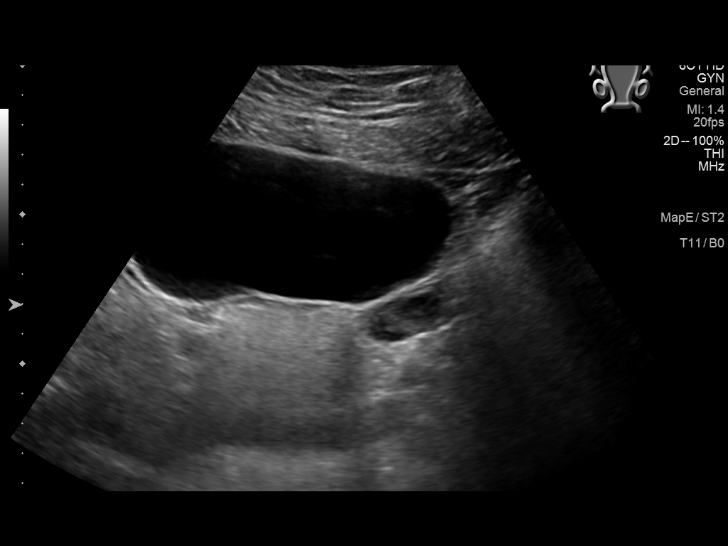
[im 25/74]
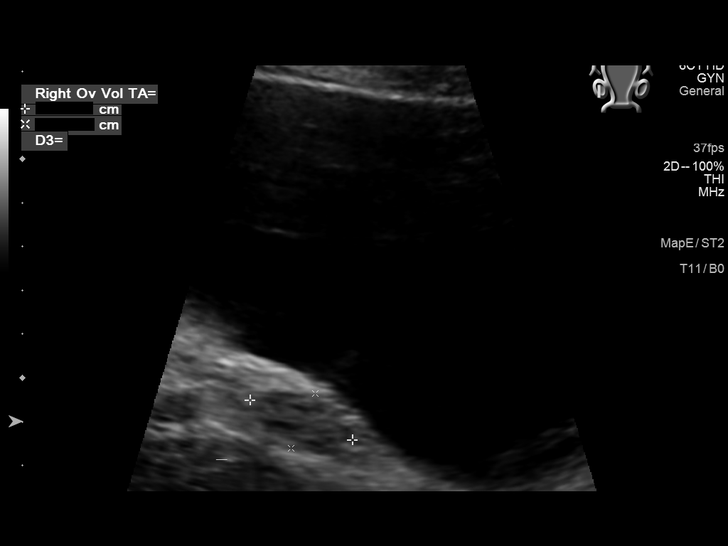
[im 31/74]
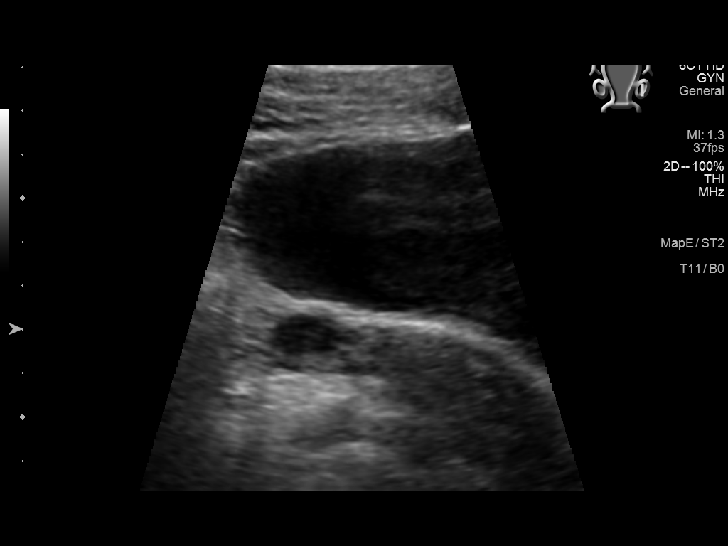
[im 37/74]
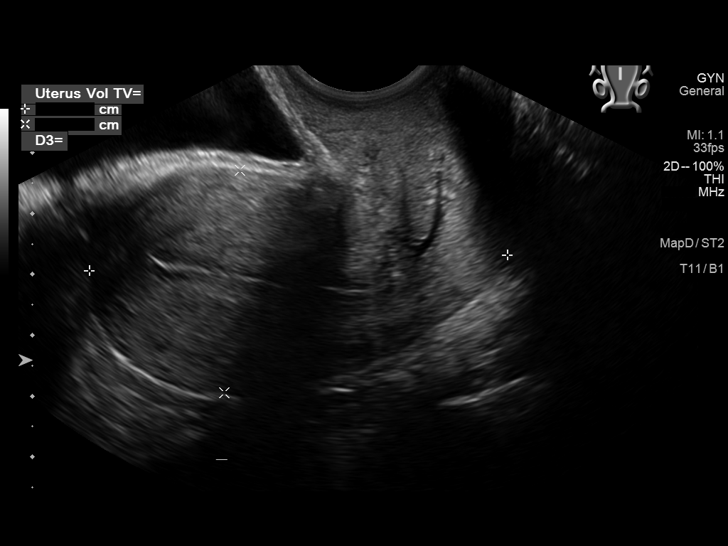
[im 43/74]
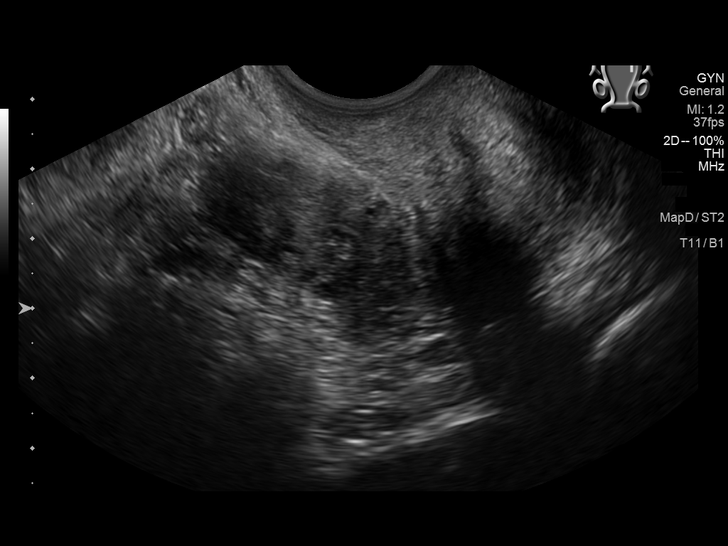
[im 49/74]
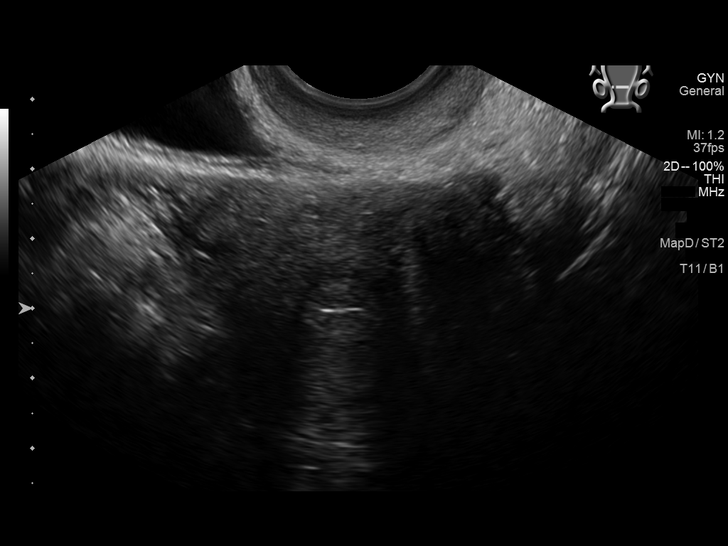
[im 55/74]
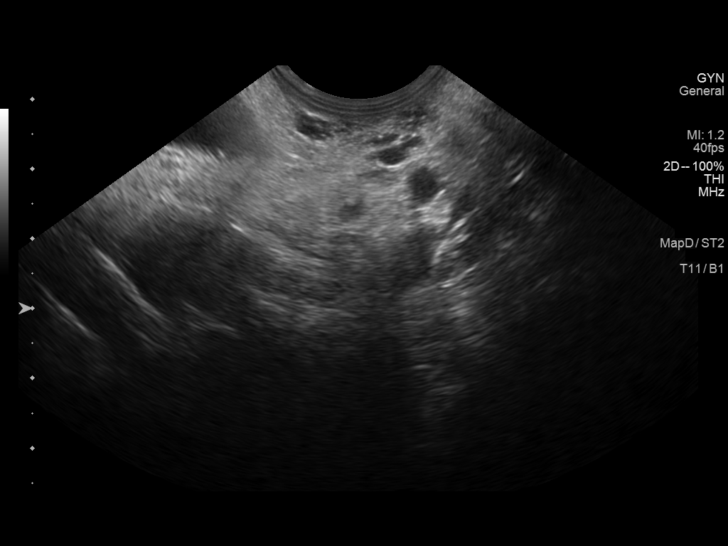
[im 61/74]
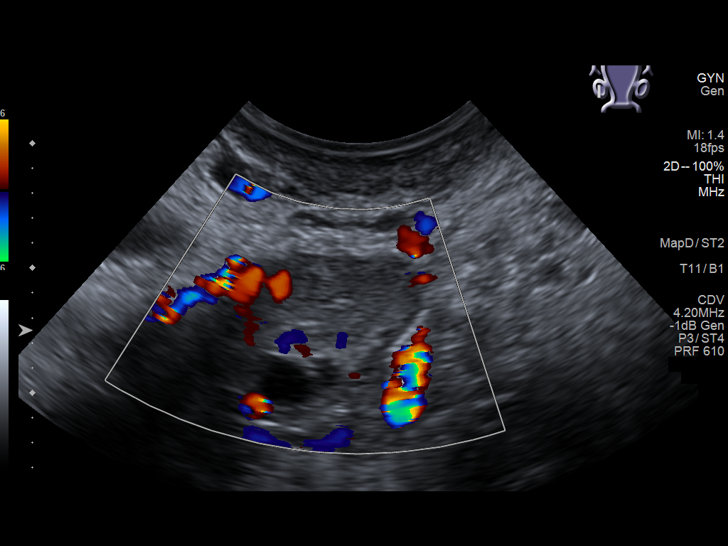
[im 67/74]
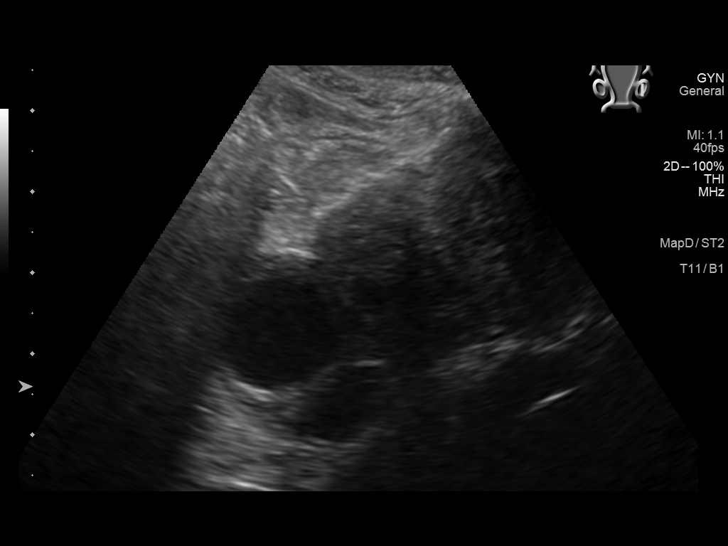
[im 74/74]
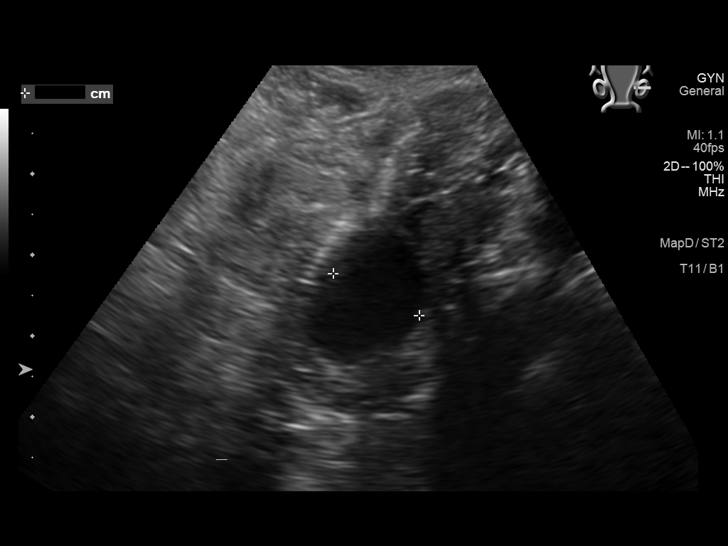

[13 of 25 positions shown; findings below may reference images not displayed]

FINDINGS: Uterus

Measurements: 9.7 x 3.2 x 4.8 cm = volume: 78.7 mL. No fibroids or
other mass visualized.

Endometrium

Thickness: 8.6 mm.  No focal abnormality visualized.

Right ovary

Measurements: 2.1 x 1.2 x 1.4 cm = volume: 1.8 mL. Normal
appearance/no adnexal mass.

Left ovary

Measurements: 2.7 x 2.3 x 1.7 cm = volume: 5.5 mL. Normal
appearance/no adnexal mass.

Other findings

No abnormal free fluid.
IMPRESSION: 1. The endometrial stripe complex measures 8.6 mm today which is
normal for age. If bleeding remains unresponsive to hormonal or
medical therapy, sonohysterogram should be considered for focal
lesion work-up. (Ref: Radiological Reasoning: Algorithmic Workup of
Abnormal Vaginal Bleeding with Endovaginal Sonography and
Sonohysterography. [id number]; [id number]:S68-73)
2. No other abnormalities.
